# Patient Record
Sex: Female | Born: 1950 | Race: White | Marital: Married | Smoking: Never smoker
Health system: Southern US, Community
[De-identification: ages and names within clinical notes are randomized; demographics above are authoritative.]

## PROBLEM LIST (undated history)

## (undated) DIAGNOSIS — S022XXA Fracture of nasal bones, initial encounter for closed fracture: Secondary | ICD-10-CM

## (undated) DIAGNOSIS — H9190 Unspecified hearing loss, unspecified ear: Secondary | ICD-10-CM

## (undated) DIAGNOSIS — M810 Age-related osteoporosis without current pathological fracture: Secondary | ICD-10-CM

## (undated) DIAGNOSIS — H9319 Tinnitus, unspecified ear: Secondary | ICD-10-CM

## (undated) DIAGNOSIS — S62609A Fracture of unspecified phalanx of unspecified finger, initial encounter for closed fracture: Secondary | ICD-10-CM

## (undated) DIAGNOSIS — S0590XA Unspecified injury of unspecified eye and orbit, initial encounter: Secondary | ICD-10-CM

## (undated) HISTORY — PX: TONSILLECTOMY: SHX5217

## (undated) HISTORY — DX: Unspecified injury of unspecified eye and orbit, initial encounter: S05.90XA

## (undated) HISTORY — DX: Age-related osteoporosis without current pathological fracture: M81.0

## (undated) HISTORY — DX: Fracture of nasal bones, initial encounter for closed fracture: S02.2XXA

## (undated) HISTORY — DX: Unspecified hearing loss, unspecified ear: H91.90

## (undated) HISTORY — DX: Fracture of unspecified phalanx of unspecified finger, initial encounter for closed fracture: S62.609A

## (undated) HISTORY — PX: FACIAL COSMETIC SURGERY: SHX629

## (undated) HISTORY — DX: Tinnitus, unspecified ear: H93.19

---

## 1999-05-25 ENCOUNTER — Other Ambulatory Visit: Admission: RE | Admit: 1999-05-25 | Discharge: 1999-05-25 | Payer: Self-pay | Admitting: *Deleted

## 2000-06-05 ENCOUNTER — Other Ambulatory Visit: Admission: RE | Admit: 2000-06-05 | Discharge: 2000-06-05 | Payer: Self-pay | Admitting: *Deleted

## 2006-10-17 ENCOUNTER — Other Ambulatory Visit: Admission: RE | Admit: 2006-10-17 | Discharge: 2006-10-17 | Payer: Self-pay | Admitting: Obstetrics & Gynecology

## 2007-10-26 ENCOUNTER — Other Ambulatory Visit: Admission: RE | Admit: 2007-10-26 | Discharge: 2007-10-26 | Payer: Self-pay | Admitting: Obstetrics & Gynecology

## 2008-10-27 ENCOUNTER — Other Ambulatory Visit: Admission: RE | Admit: 2008-10-27 | Discharge: 2008-10-27 | Payer: Self-pay | Admitting: Obstetrics & Gynecology

## 2011-11-04 LAB — HM MAMMOGRAPHY

## 2013-02-17 ENCOUNTER — Encounter: Payer: Self-pay | Admitting: Obstetrics & Gynecology

## 2013-02-18 ENCOUNTER — Encounter: Payer: Self-pay | Admitting: Obstetrics & Gynecology

## 2013-02-18 ENCOUNTER — Ambulatory Visit (INDEPENDENT_AMBULATORY_CARE_PROVIDER_SITE_OTHER): Payer: BC Managed Care – PPO | Admitting: Obstetrics & Gynecology

## 2013-02-18 VITALS — BP 110/76 | HR 78 | Resp 16 | Ht 64.5 in | Wt 139.6 lb

## 2013-02-18 DIAGNOSIS — Z01419 Encounter for gynecological examination (general) (routine) without abnormal findings: Secondary | ICD-10-CM

## 2013-02-18 NOTE — Progress Notes (Addendum)
62 y.o. [id number] MarriedCaucasianF here for annual exam.  Doing well.  Stopped Prolia due to joint/bone pains.    Patient's last menstrual period was 11/23/2004.          Sexually active: no  The current method of family planning is none.    Exercising: no  The patient does not participate in regular exercise at present. Smoker:  no  Health Maintenance: Pap:  01/30/12 History of abnormal Pap:  yes MMG:  12/30/12, [facility] Colonoscopy:  05/29/12, Dr. Loreta Ave, F/U 10 years.  Pt says she will not do another one! BMD:   04/30/11 TDaP:  11/09/09 Screening Labs: 5/14 with Dr. Record, Hb today: none, Urine today: none.  Declines labs today had  Them at PCP one month ago.   reports that she has never smoked. She has never used smokeless tobacco. She reports that she does not drink alcohol or use illicit drugs.  Past Medical History  Diagnosis Date  . Hearing loss   . Osteoporosis     Past Surgical History  Procedure Laterality Date  . Tonsillectomy    . Facial cosmetic surgery      Current Outpatient Prescriptions  Medication Sig Dispense Refill  . aspirin-acetaminophen-caffeine (EXCEDRIN MIGRAINE) 250-250-65 MG per tablet Take 1 tablet by mouth every 6 (six) hours as needed for pain.      Marland Kitchen buPROPion (WELLBUTRIN XL) 300 MG 24 hr tablet Take 300 mg by mouth daily.      . Coenzyme Q10 (CO Q 10 PO) Take by mouth.      . etodolac (LODINE) 400 MG tablet Take 400 mg by mouth 2 (two) times daily.      . Multiple Vitamin (MULTIVITAMIN) tablet Take 1 tablet by mouth daily.      . Sod Monofluorphosphate-Ca Carb (MONOCAL) 22.75-625 MG TABS Take by mouth.       No current facility-administered medications for this visit.    Family History  Problem Relation Age of Onset  . Breast cancer Maternal Grandmother   . Heart disease    . Parkinson's disease Mother   . Heart disease Father     ROS:  Pertinent items are noted in HPI.  Otherwise, a comprehensive ROS was  negative.  Exam:   BP 110/76  Pulse 78  Resp 16  Ht 5' 4.5" (1.638 m)  Wt 139 lb 9.6 oz (63.322 kg)  BMI 23.6 kg/m2  LMP 11/23/2004  Weight change: +3lb   Height: 5' 4.5" (163.8 cm)  Ht Readings from Last 3 Encounters:  02/18/13 5' 4.5" (1.638 m)    General appearance: alert, cooperative and appears stated age Head: Normocephalic, without obvious abnormality, atraumatic Neck: no adenopathy, supple, symmetrical, trachea midline and thyroid normal to inspection and palpation Lungs: clear to auscultation bilaterally Breasts: normal appearance, no masses or tenderness Heart: regular rate and rhythm Abdomen: soft, non-tender; bowel sounds normal; no masses,  no organomegaly Extremities: extremities normal, atraumatic, no cyanosis or edema Skin: Skin color, texture, turgor normal. No rashes or lesions Lymph nodes: Cervical, supraclavicular, and axillary nodes normal. No abnormal inguinal nodes palpated Neurologic: Grossly normal   Pelvic: External genitalia:  no lesions              Urethra:  normal appearing urethra with no masses, tenderness or lesions              Bartholins and Skenes: normal  Vagina: normal appearing vagina with normal color and discharge, no lesions              Cervix: no lesions              Pap taken: no Bimanual Exam:  Uterus:  normal size, contour, position, consistency, mobility, non-tender              Adnexa: normal adnexa and no mass, fullness, tenderness               Rectovaginal: Confirms               Anus:  normal sphincter tone, no lesions  A:  Well Woman with normal exam PMP, no HRT Osteoporosis.  Declines any other treatment at this time. P:   Mammogram yearly.   pap smear with neg HR HPV one year ago.  No Pap today. Yearly derm appt.  09/18/12. Will attempt to get BMD to see when next one it due.   return annually or prn  An After Visit Summary was printed and given to the patient.  02/23/13--Got BMD from [facility].  Was done 04/30/11.  Will notify patient and see where she wants to go for BMD this fall.  MSM.

## 2013-02-18 NOTE — Patient Instructions (Addendum)

## 2013-05-10 ENCOUNTER — Telehealth: Payer: Self-pay | Admitting: Obstetrics & Gynecology

## 2013-05-10 NOTE — Telephone Encounter (Signed)
Patient calling for bone density results from 05/07/13.

## 2013-05-11 NOTE — Telephone Encounter (Signed)
Spoke with patient. Results given from paper form per Dr. Hyacinth Meeker, stable from 2012. Can make office appointment to discuss IV reclast if patient would like, patient declines at this time.   Patient would like to come pick up results. Paper to front desk with Greta.

## 2014-04-19 ENCOUNTER — Ambulatory Visit: Payer: BC Managed Care – PPO | Admitting: Obstetrics & Gynecology

## 2014-06-27 ENCOUNTER — Encounter: Payer: Self-pay | Admitting: Obstetrics & Gynecology

## 2014-11-04 ENCOUNTER — Encounter: Payer: Self-pay | Admitting: Obstetrics & Gynecology

## 2014-11-04 ENCOUNTER — Ambulatory Visit (INDEPENDENT_AMBULATORY_CARE_PROVIDER_SITE_OTHER): Payer: BC Managed Care – PPO | Admitting: Obstetrics & Gynecology

## 2014-11-04 VITALS — BP 118/60 | HR 68 | Resp 16 | Ht 64.0 in | Wt 144.8 lb

## 2014-11-04 DIAGNOSIS — M810 Age-related osteoporosis without current pathological fracture: Secondary | ICD-10-CM | POA: Insufficient documentation

## 2014-11-04 DIAGNOSIS — F419 Anxiety disorder, unspecified: Secondary | ICD-10-CM | POA: Insufficient documentation

## 2014-11-04 DIAGNOSIS — H9190 Unspecified hearing loss, unspecified ear: Secondary | ICD-10-CM | POA: Insufficient documentation

## 2014-11-04 DIAGNOSIS — Z01419 Encounter for gynecological examination (general) (routine) without abnormal findings: Secondary | ICD-10-CM

## 2014-11-04 DIAGNOSIS — Z124 Encounter for screening for malignant neoplasm of cervix: Secondary | ICD-10-CM | POA: Diagnosis not present

## 2014-11-04 DIAGNOSIS — M545 Low back pain, unspecified: Secondary | ICD-10-CM | POA: Insufficient documentation

## 2014-11-04 DIAGNOSIS — M546 Pain in thoracic spine: Secondary | ICD-10-CM | POA: Insufficient documentation

## 2014-11-04 DIAGNOSIS — G43909 Migraine, unspecified, not intractable, without status migrainosus: Secondary | ICD-10-CM | POA: Insufficient documentation

## 2014-11-04 NOTE — Progress Notes (Signed)
64 y.o. [id number] MarriedCaucasianF here for annual exam.  Doing well.  Reports she fell last May and broke her left hand.  Was in a cast for several weeks.  Didn't go physical therapy after surgery.    No vaginal bleeding.   Started Prolia last year.  Did have two injections but had side effects.    Dr. Record is PCP and does all her lab work.   Patient's last menstrual period was 11/23/2004.          Sexually active: No.  The current method of family planning is post menopausal status.    Exercising: Yes.    gym Smoker:  no  Health Maintenance: Pap:  01/29/12 WNL/negative HR HPV History of abnormal Pap:  no MMG:  01/10/14-normal Colonoscopy:  05/29/12-repeat in 10 years Dr Loreta AveMann BMD:   04/2013.  Did have two injections.   TDaP:  11/09/09 Screening Labs: PCP, Hb today: PCP, Urine today: not able to give sample   reports that she has never smoked. She has never used smokeless tobacco. She reports that she does not drink alcohol or use illicit drugs.  Past Medical History  Diagnosis Date  . Hearing loss   . Osteoporosis   . Broken finger 01/01/14    fell-left side    Past Surgical History  Procedure Laterality Date  . Tonsillectomy    . Facial cosmetic surgery      Current Outpatient Prescriptions  Medication Sig Dispense Refill  . ALPRAZolam (XANAX) 0.5 MG tablet Take 0.5 mg by mouth. Just as needed    . aspirin-acetaminophen-caffeine (EXCEDRIN MIGRAINE) 250-250-65 MG per tablet Take 1 tablet by mouth every 6 (six) hours as needed for pain.    Marland Kitchen. buPROPion (WELLBUTRIN XL) 300 MG 24 hr tablet Take 300 mg by mouth daily.    Marland Kitchen. etodolac (LODINE) 400 MG tablet Take 400 mg by mouth 2 (two) times daily.    . Multiple Vitamin (MULTIVITAMIN) tablet Take 1 tablet by mouth daily.    . NON FORMULARY Shaklee Osteomatric    . Sod Monofluorphosphate-Ca Carb (MONOCAL) 22.75-625 MG TABS Take by mouth.    . pantoprazole (PROTONIX) 40 MG tablet TAKE 1 TABLET (40 MG TOTAL) BY MOUTH DAILY.     No  current facility-administered medications for this visit.    Family History  Problem Relation Age of Onset  . Breast cancer Maternal Grandmother   . Heart disease    . Parkinson's disease Mother   . Heart disease Father     ROS:  Pertinent items are noted in HPI.  Otherwise, a comprehensive ROS was negative.  Exam:   BP 118/60 mmHg  Pulse 68  Resp 16  Ht 5\' 4"  (1.626 m)  Wt 144 lb 12.8 oz (65.681 kg)  BMI 24.84 kg/m2  LMP 11/23/2004   Height: 5\' 4"  (162.6 cm)  Ht Readings from Last 3 Encounters:  11/04/14 5\' 4"  (1.626 m)  02/18/13 5' 4.5" (1.638 m)    General appearance: alert, cooperative and appears stated age Head: Normocephalic, without obvious abnormality, atraumatic Neck: no adenopathy, supple, symmetrical, trachea midline and thyroid normal to inspection and palpation Lungs: clear to auscultation bilaterally Breasts: normal appearance, no masses or tenderness Heart: regular rate and rhythm Abdomen: soft, non-tender; bowel sounds normal; no masses,  no organomegaly Extremities: extremities normal, atraumatic, no cyanosis or edema Skin: Skin color, texture, turgor normal. No rashes or lesions Lymph nodes: Cervical, supraclavicular, and axillary nodes normal. No abnormal inguinal nodes palpated Neurologic: Grossly normal  Pelvic: External genitalia:  no lesions              Urethra:  normal appearing urethra with no masses, tenderness or lesions              Bartholins and Skenes: normal                 Vagina: normal appearing vagina with normal color and discharge, no lesions              Cervix: no lesions              Pap taken: Yes.   Bimanual Exam:  Uterus:  normal size, contour, position, consistency, mobility, non-tender              Adnexa: normal adnexa and no mass, fullness, tenderness               Rectovaginal: Confirms               Anus:  normal sphincter tone, no lesions  Chaperone was present for exam.  A:  Well Woman with normal exam PMP,  no HRT Osteoporosis. Did two Prolia injections but did have side effects so stopped  P: Mammogram yearly.  pap smear with neg HR HPV  2014.  Pap today. Yearly derm appt.  PCP:  Dr Record does all her blood work. return annually or prn

## 2014-11-09 LAB — IPS PAP TEST WITH REFLEX TO HPV

## 2014-11-28 DIAGNOSIS — K219 Gastro-esophageal reflux disease without esophagitis: Secondary | ICD-10-CM | POA: Insufficient documentation

## 2015-02-07 DIAGNOSIS — M722 Plantar fascial fibromatosis: Secondary | ICD-10-CM | POA: Insufficient documentation

## 2015-05-16 ENCOUNTER — Telehealth: Payer: Self-pay

## 2015-05-16 DIAGNOSIS — M25559 Pain in unspecified hip: Secondary | ICD-10-CM | POA: Insufficient documentation

## 2015-05-16 NOTE — Telephone Encounter (Signed)
-----   Message from Jerene Bears, MD sent at 05/07/2015  6:36 AM EDT ----- Tresa Endo, Pt needs BMD scheduled at [facility] center.  Can you take care of this? Thanks.  Rosalita Chessman

## 2015-05-16 NOTE — Telephone Encounter (Signed)
Left detailed message, ok per DPR, that BMD has been scheduled at [facility] on 05/23/15 at 2:00. And to arrive 15 mins prior to this. They will also need order faxed, # [phone].

## 2015-05-23 NOTE — Telephone Encounter (Signed)
Patient called she received your message wanted to confirm address, patient aware of appointment date and time.

## 2015-06-15 ENCOUNTER — Telehealth: Payer: Self-pay | Admitting: Obstetrics & Gynecology

## 2015-06-15 NOTE — Telephone Encounter (Signed)
Patient notified of BMD results from [facility]. Copy will be scanned into epic.//kn

## 2015-06-15 NOTE — Telephone Encounter (Signed)
Patient is calling to see if she can discuss her Bone Density with the nurse.

## 2015-06-15 NOTE — Telephone Encounter (Signed)
Appointment made for BMD consult.//kn

## 2015-06-15 NOTE — Telephone Encounter (Signed)
Spoke with patient. Patient states that she spoke with Tresa EndoKelly a few days ago regarding her Bone Density. Was advised Dr.Miller would like her to come in for a consult appointment. Patient is waiting to hear back from [place] about if she still needs this appointment and the cost. "I never heard back so I was not sure if they just thought it was okay not to have it." Advised I will need to speak with Tresa EndoKelly regarding recommendations and information and have her return call. No open telephone note regarding bone density results and recommendations.

## 2015-06-19 ENCOUNTER — Encounter: Payer: Self-pay | Admitting: Obstetrics & Gynecology

## 2015-06-19 ENCOUNTER — Ambulatory Visit (INDEPENDENT_AMBULATORY_CARE_PROVIDER_SITE_OTHER): Payer: BC Managed Care – PPO | Admitting: Obstetrics & Gynecology

## 2015-06-19 VITALS — BP 118/72 | HR 64 | Resp 16 | Wt 141.0 lb

## 2015-06-19 DIAGNOSIS — F339 Major depressive disorder, recurrent, unspecified: Secondary | ICD-10-CM | POA: Insufficient documentation

## 2015-06-19 DIAGNOSIS — M810 Age-related osteoporosis without current pathological fracture: Secondary | ICD-10-CM | POA: Diagnosis not present

## 2015-06-19 LAB — PHOSPHORUS: Phosphorus: 3.8 mg/dL (ref 2.5–4.5)

## 2015-06-19 LAB — MAGNESIUM: Magnesium: 2.3 mg/dL (ref 1.5–2.5)

## 2015-06-19 NOTE — Progress Notes (Signed)
Subjective:    [age] yrs Married Caucasian G2P2002  female here to discuss recent BMD obtained 05/23/15 showing osteoporosis with T score -3.0 in left hip and spine.  Pt is a calcium supplement with Vit D and a MVI daily.  She admits her diet isn't "great".    She has tried Fosamax, Actonel, Evista.  Took two injections of prolia last year.  She ends up stopping medications because of the side effects of body aches and specifically legs.  She's had at least two cardiac evaluations due to the body pains.  It always resolves when she stops the medication.  She is very apprehensive about using Reclast.  Osteoporosis Risk Factors  Nonmodifiable  Personal Hx of fracture as an adult: no Hx of fracture in first-degree relative: no Caucasian race: yes Advanced age: no Female sex: yes Dementia: no Poor health/frailty: no  Potentially modifiable: Tobacco use: no Low body weight (<127 lbs): no Estrogen deficiency  early menopause (age <45) or bilateral ovariectomy: no  prolonged premenopausal amenorrhea (>1 yr): no Low calcium intake (lifelong): yes Alcohol use more than 2 drinks per day: no Recurrent falls: no Inadequate physical activity: Pt walking on treadmill and weight circuit three times weekly  Current calcium and Vit D intake: around 900mg  calcium with Vit D (pt unsure of dosage)  Review of Systems Pertinent items noted in HPI and remainder of comprehensive ROS otherwise negative.     Objective:   PHYSICAL EXAM BP 118/72 mmHg  Pulse 64  Resp 16  Wt 141 lb (63.957 kg)  LMP 11/23/2004 General appearance: alert, cooperative and appears stated age  Imaging Bone Density: Spine T Score: -3.0 Hip T score: -3.0   Done on 05/23/15 FRAX score:  Not calculated due to osteoporosis                                    Assessment:   Osteoporosis with T score -3.0 left hip and spine   Plan:   1.  Patient counseled in adequate calcium and vitamin D exposure. 2.  Exercise recommended  at least 30 minutes 3 times per week.  3.  Encouraged minimal ETOH, continued non-smoking status.  Recommendation to avoid heavy ETOH use.  4.  Counseled to avoid tobacco and second had smoke. 5.  Fall prevention discussed. 6.  Pt declines any additional medication treatment at this time 7.  Magnesium, phosphorus, PTH with intact calcium, Vit D today.  (reviewed Care Everywhere and she's had a TSH and CMP in the last few months) 7.  Repeat bone density in 2 years.     ~20 minutes spent with patient >50% of time was in face to face discussion of above.

## 2015-06-20 LAB — VITAMIN D 25 HYDROXY (VIT D DEFICIENCY, FRACTURES): Vit D, 25-Hydroxy: 31 ng/mL (ref 30–100)

## 2015-06-20 LAB — PTH, INTACT AND CALCIUM
Calcium: 9.9 mg/dL (ref 8.4–10.5)
PTH: 42 pg/mL (ref 14–64)

## 2015-06-22 ENCOUNTER — Telehealth: Payer: Self-pay

## 2015-06-22 NOTE — Telephone Encounter (Signed)
Lmtcb//kn 

## 2015-06-22 NOTE — Telephone Encounter (Signed)
-----   Message from Jerene BearsMary S Miller, MD sent at 06/21/2015  2:42 PM EDT ----- Inform pt PTH with calcium, Vit D, magnesium and phosphorus were all normal.  Pt does not want to be on any additional medication.  Repeat BMD two years.

## 2015-06-22 NOTE — Telephone Encounter (Signed)
Patient returning your call.

## 2015-06-26 NOTE — Telephone Encounter (Signed)
Patient is returning a call to Kelly °

## 2015-06-29 NOTE — Telephone Encounter (Signed)
Patient notified of results.//kn 

## 2016-01-09 ENCOUNTER — Ambulatory Visit (INDEPENDENT_AMBULATORY_CARE_PROVIDER_SITE_OTHER): Payer: Medicare Other | Admitting: Obstetrics & Gynecology

## 2016-01-09 ENCOUNTER — Encounter: Payer: Self-pay | Admitting: Obstetrics & Gynecology

## 2016-01-09 VITALS — BP 120/76 | HR 76 | Resp 14 | Ht 64.0 in | Wt 144.2 lb

## 2016-01-09 DIAGNOSIS — Z01419 Encounter for gynecological examination (general) (routine) without abnormal findings: Secondary | ICD-10-CM
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
## 2016-04-30 ENCOUNTER — Telehealth: Payer: Self-pay | Admitting: *Deleted

## 2016-04-30 NOTE — Telephone Encounter (Signed)
Patient is in 04 recall for Left DX Mammogram. Please contact patient and schedule or get results if she already had this follow up done Thanks Consuella LoseElaine

## 2016-05-01 NOTE — Telephone Encounter (Signed)
1225- Call to patient. Patient states she had her MMG done on 03/15/16 and our office was on the release form to get a copy. RN advised patient she would follow up.   1330- Request form faxed to [facility], fax # [phone] for release of MMG report.

## 2016-05-07 NOTE — Telephone Encounter (Signed)
Recall placed for 03/2016 for 1 year follow up DX left breast mammogram. -eh

## 2016-05-21 ENCOUNTER — Encounter: Payer: Self-pay | Admitting: Obstetrics & Gynecology

## 2016-09-10 DIAGNOSIS — J343 Hypertrophy of nasal turbinates: Secondary | ICD-10-CM | POA: Insufficient documentation

## 2016-10-02 HISTORY — PX: NOSE SURGERY: SHX723

## 2017-01-09 ENCOUNTER — Encounter: Payer: Self-pay | Admitting: Obstetrics & Gynecology

## 2017-01-09 ENCOUNTER — Ambulatory Visit (INDEPENDENT_AMBULATORY_CARE_PROVIDER_SITE_OTHER): Payer: Medicare Other | Admitting: Obstetrics & Gynecology

## 2017-01-09 ENCOUNTER — Other Ambulatory Visit (HOSPITAL_COMMUNITY)
Admission: RE | Admit: 2017-01-09 | Discharge: 2017-01-09 | Disposition: A | Discharge status: Home / Self Care | Payer: Medicare Other | Source: Ambulatory Visit | Attending: Obstetrics & Gynecology | Admitting: Obstetrics & Gynecology

## 2017-01-09 VITALS — BP 110/70 | HR 84 | Resp 16 | Ht 64.0 in | Wt 148.0 lb

## 2017-01-09 DIAGNOSIS — Z01419 Encounter for gynecological examination (general) (routine) without abnormal findings: Secondary | ICD-10-CM | POA: Diagnosis not present

## 2017-01-09 DIAGNOSIS — Z124 Encounter for screening for malignant neoplasm of cervix: Secondary | ICD-10-CM | POA: Diagnosis present

## 2017-01-09 DIAGNOSIS — M810 Age-related osteoporosis without current pathological fracture: Secondary | ICD-10-CM | POA: Diagnosis not present
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
## 2017-01-10 ENCOUNTER — Ambulatory Visit: Payer: Medicare Other | Admitting: Obstetrics & Gynecology

## 2017-01-10 LAB — CYTOLOGY - PAP: DIAGNOSIS: NEGATIVE

## 2017-04-18 ENCOUNTER — Ambulatory Visit: Payer: Medicare Other | Admitting: Obstetrics & Gynecology

## 2017-05-20 ENCOUNTER — Encounter: Payer: Self-pay | Admitting: Obstetrics & Gynecology

## 2017-05-23 ENCOUNTER — Encounter: Payer: Self-pay | Admitting: *Deleted

## 2017-05-23 ENCOUNTER — Telehealth: Payer: Self-pay | Admitting: *Deleted

## 2017-05-23 NOTE — Telephone Encounter (Signed)
-----   Message from Jerene Bears, MD sent at 05/23/2017  8:18 AM EDT ----- Regarding: BMD Irving Burton, Please call pt.  She needs a BMD scheduled.  Can you see if she needs help with scheduling this with [facility]?  Thanks.  Rosalita Chessman

## 2017-05-23 NOTE — Telephone Encounter (Signed)
Returned call to patient. Advised patient RN scheduled her BMD scan for Thursday 06/05/17 at 1400. Patient agreeable to date and time of appointment and aware to call [facility] if she needs to reschedule. Order for review and signature to Dr. Rondel Baton desk.   Routing to provider for final review. Patient agreeable to disposition. Will close encounter.

## 2017-05-23 NOTE — Telephone Encounter (Signed)
Call to patient. Patient states she has not made an appointment for BMD scan yet. RN offered to help get patient scheduled. Patient agreeable. Patient does BMD scans at [facility]. Patient aware RN will return call with appointment date and time.

## 2017-05-23 NOTE — Telephone Encounter (Signed)
Call to [facility]. Spoke with [place]. Patient scheduled for BMD scan on Thursday 06/05/17 at 1400.

## 2017-05-27 NOTE — Telephone Encounter (Signed)
BMD order faxed to [facility]. Fax #: [phone].

## 2018-03-27 ENCOUNTER — Encounter: Payer: Self-pay | Admitting: Obstetrics & Gynecology

## 2018-03-27 ENCOUNTER — Ambulatory Visit (INDEPENDENT_AMBULATORY_CARE_PROVIDER_SITE_OTHER): Payer: Medicare Other | Admitting: Obstetrics & Gynecology

## 2018-03-27 ENCOUNTER — Other Ambulatory Visit: Payer: Self-pay

## 2018-03-27 VITALS — BP 110/70 | HR 80 | Resp 16 | Ht 63.5 in | Wt 137.8 lb

## 2018-03-27 DIAGNOSIS — Z01419 Encounter for gynecological examination (general) (routine) without abnormal findings: Secondary | ICD-10-CM

## 2018-03-27 MED ORDER — BUPROPION HCL ER (XL) 150 MG PO TB24: 150.0000 mg | ORAL_TABLET | Freq: Every day | ORAL | 4 refills | Status: DC

## 2018-03-27 NOTE — Patient Instructions (Signed)
[facility] [address], [place], [place] [address]  Phone: [phone]

## 2018-03-27 NOTE — Progress Notes (Signed)
67 y.o. [id number] MarriedCaucasianF here for annual exam.  Doing well.  Denies vaginal bleeding.  She isn't having nose bleeds like she was last year.    Denies vaginal bleeding.  BMD 11/18 showed t score worsening -3.4 and -3.4.  Does not want to take any medications.    Had blood work done last month at [facility].  PCP is leaving practice.    Patient's last menstrual period was 11/23/2004.          Sexually active: No.  The current method of family planning is post menopausal status.    Exercising: Yes.    walking Smoker:  no  Health Maintenance: Pap:  01/09/17 Neg   11/04/14 neg  History of abnormal Pap:  Yes, remote hx MMG:  03/19/18 BIRADS1:Neg   Colonoscopy:  05/29/12 f/u 10 years  BMD:   06/05/17 Osteoporosis.  t score -3.4.  This was worse than imaging in 2016.   TDaP:  2011 Pneumonia vaccine(s):  2018 Shingrix: Zostavax 11/13.  Declines Shingrix. Hep C testing: 11/17 Screening Labs: PCP   reports that she has never smoked. She has never used smokeless tobacco. She reports that she does not drink alcohol or use drugs.  Past Medical History:  Diagnosis Date  . Broken finger 01/01/14   fell-left side  . Eye injury    cut with nail.   Marland Kitchen Hearing loss   . Nose fracture 07/2016   due to fall   . Osteoporosis     Past Surgical History:  Procedure Laterality Date  . FACIAL COSMETIC SURGERY    . NOSE SURGERY  10/02/2016   due to injury  . TONSILLECTOMY      Current Outpatient Medications  Medication Sig Dispense Refill  . aspirin-acetaminophen-caffeine (EXCEDRIN MIGRAINE) 250-250-65 MG per tablet Take 1 tablet by mouth every 6 (six) hours as needed for pain.    Marland Kitchen buPROPion (WELLBUTRIN XL) 300 MG 24 hr tablet Take 300 mg by mouth daily.    . calcium carbonate (CALCIUM 600) 600 MG TABS tablet Take by mouth daily.    . Multiple Vitamin (MULTIVITAMIN) tablet Take 1 tablet by mouth daily.    . NON FORMULARY Shaklee Osteomatric     No current facility-administered  medications for this visit.     Family History  Problem Relation Age of Onset  . Breast cancer Maternal Grandmother   . Heart disease Unknown   . Parkinson's disease Mother   . Heart disease Father     Review of Systems  Psychiatric/Behavioral: Positive for depression. The patient is nervous/anxious.   All other systems reviewed and are negative.   Exam:   BP 110/70 (BP Location: Right Arm, Patient Position: Sitting, Cuff Size: Normal)   Pulse 80   Resp 16   Ht 5' 3.5" (1.613 m)   Wt 137 lb 12.8 oz (62.5 kg)   LMP 11/23/2004   BMI 24.03 kg/m   Height:   Height: 5' 3.5" (161.3 cm)  Ht Readings from Last 3 Encounters:  03/27/18 5' 3.5" (1.613 m)  01/09/17 5\' 4"  (1.626 m)  01/09/16 5\' 4"  (1.626 m)    General appearance: alert, cooperative and appears stated age Head: Normocephalic, without obvious abnormality, atraumatic Neck: no adenopathy, supple, symmetrical, trachea midline and thyroid normal to inspection and palpation Lungs: clear to auscultation bilaterally Breasts: normal appearance, no masses or tenderness Heart: regular rate and rhythm Abdomen: soft, non-tender; bowel sounds normal; no masses,  no organomegaly, small umbilical hernia noted Extremities: extremities normal,  atraumatic, no cyanosis or edema Skin: Skin color, texture, turgor normal. No rashes or lesions Lymph nodes: Cervical, supraclavicular, and axillary nodes normal. No abnormal inguinal nodes palpated Neurologic: Grossly normal   Pelvic: External genitalia:  no lesions              Urethra:  normal appearing urethra with no masses, tenderness or lesions              Bartholins and Skenes: normal                 Vagina: normal appearing vagina with normal color and discharge, no lesions              Cervix: no lesions              Pap taken: No. Bimanual Exam:  Uterus:  normal size, contour, position, consistency, mobility, non-tender              Adnexa: normal adnexa and no mass, fullness,  tenderness               Rectovaginal: Confirms               Anus:  normal sphincter tone, no lesions  Chaperone was present for exam.  A:  Well Woman with normal exam PMP, no HRT Osteoporosis.  Had two prolia injections.  Has stopped due to side effects. H/O depression.  Desires to decrease dosing. Small umbilical hernia  P:   Mammogram guidelines reviewed. pap smear not indicated Had blood work done last week Declines any treatment for BMD.  Secondary causes blood work done 2016. Pt will decrease wellbutrin to 150mg  XL daily for at least two month before tapering further.  Will call with any changes in mood.   Return annually or prn

## 2018-04-09 ENCOUNTER — Encounter: Payer: Self-pay | Admitting: Obstetrics & Gynecology

## 2018-04-18 ENCOUNTER — Other Ambulatory Visit: Payer: Self-pay | Admitting: Obstetrics & Gynecology

## 2019-01-07 ENCOUNTER — Ambulatory Visit: Payer: Medicare Other | Admitting: Obstetrics & Gynecology

## 2019-01-07 ENCOUNTER — Telehealth: Payer: Self-pay | Admitting: Obstetrics & Gynecology

## 2019-01-07 ENCOUNTER — Encounter: Payer: Self-pay | Admitting: Obstetrics & Gynecology

## 2019-01-07 ENCOUNTER — Other Ambulatory Visit: Payer: Self-pay

## 2019-01-07 VITALS — BP 120/78 | HR 70 | Temp 98.0°F | Resp 16 | Wt 151.0 lb

## 2019-01-07 DIAGNOSIS — R109 Unspecified abdominal pain: Secondary | ICD-10-CM

## 2019-01-07 LAB — POCT URINALYSIS DIPSTICK
Bilirubin, UA: NEGATIVE
Blood, UA: NEGATIVE
Glucose, UA: NEGATIVE
Ketones, UA: NEGATIVE
Leukocytes, UA: NEGATIVE
Nitrite, UA: NEGATIVE
Protein, UA: NEGATIVE
Urobilinogen, UA: NEGATIVE E.U./dL — AB
pH, UA: 5 (ref 5.0–8.0)

## 2019-01-07 NOTE — Telephone Encounter (Signed)
Patient has a hernia and believes it has grown. She is wearing a corset for pain. Would like to know if Dr. Hyacinth Meeker can assist with this or could recommend a provider?

## 2019-01-07 NOTE — Telephone Encounter (Signed)
Spoke with patient. Patient states that at her aex on 03/2018 Dr.Miller noticed that she had a small umbilical hernia. States that over the last few weeks she has been having constant pain. Pain worsens with bending and lifting to 8/10. Wearing a corset to put pressure on the area for relief. If she is not wearing the corset she has to press in on her abdomen to relieve the pain. Unable to eat much due to feeling bloated. Able to only perform limited activity without being in tears and having to sit down. Does not have a PCP. Advised will review with Dr.Miller and return call. Patient is agreeable.  Reviewed with Dr.Miller. Patient needs an OV today. Appointment scheduled for today at 3:45 pm with Dr.Miller. Patient is agreeable to date and time.  Routing to provider and will close encounter.

## 2019-01-07 NOTE — Progress Notes (Signed)
GYNECOLOGY  VISIT  CC:   Abdominal pain  HPI: 68 y.o. G43P2002 Married Other or two or more races female here for increased abdominal pain that has been present for the past "several months".  Pt cannot state exactly when this began.  She tends to have epigastric pain and then lower pelvic pain.  She was worried this was coming from her umbilical hernia that we discussed at her last visit.  She does wear a tighter piece of clothing to help with the discomfort.  It is worse with bending.  Denies diarrhea or constipation.  Does have occasional nausea but no emesis.  She does have bloating as well.  Denies fever.  Denies vaginal bleeding.  Denies vaginal discharge.  Denies urinary symptoms.    Colonoscopy was 2013 and normal.  GYNECOLOGIC HISTORY: Patient's last menstrual period was 11/23/2004. Contraception: post menopausal Menopausal hormone therapy: none poct urine-neg  Patient Active Problem List   Diagnosis Date Noted  . Hypertrophy of inferior nasal turbinate 09/10/2016  . Recurrent depressive disorder (HCC) 06/19/2015  . Arthralgia of hip 05/16/2015  . Plantar fasciitis 02/07/2015  . Acid reflux 11/28/2014  . Anxiety 11/04/2014  . Decrease in the ability to hear 11/04/2014  . LBP (low back pain) 11/04/2014  . Osteoporosis 11/04/2014  . Back pain, thoracic 11/04/2014  . Headache, migraine 11/04/2014    Past Medical History:  Diagnosis Date  . Broken finger 01/01/14   fell-left side  . Eye injury    cut with nail.   Marland Kitchen Hearing loss   . Nose fracture 07/2016   due to fall   . Osteoporosis     Past Surgical History:  Procedure Laterality Date  . FACIAL COSMETIC SURGERY    . NOSE SURGERY  10/02/2016   due to injury  . TONSILLECTOMY      MEDS:   Current Outpatient Medications on File Prior to Visit  Medication Sig Dispense Refill  . calcium carbonate (CALCIUM 600) 600 MG TABS tablet Take by mouth daily.    . Multiple Vitamin (MULTIVITAMIN) tablet Take 1 tablet by mouth  daily.    . NON FORMULARY Shaklee Osteomatric    . aspirin-acetaminophen-caffeine (EXCEDRIN MIGRAINE) 250-250-65 MG per tablet Take 1 tablet by mouth every 6 (six) hours as needed for pain.     No current facility-administered medications on file prior to visit.     ALLERGIES: Boniva [ibandronic acid]; Evista [raloxifene]; Lexapro [escitalopram oxalate]; Other; and Prolia [denosumab]  Family History  Problem Relation Age of Onset  . Breast cancer Maternal Grandmother   . Heart disease Unknown   . Parkinson's disease Mother   . Heart disease Father     SH:  Married, non smoker  Review of Systems  Constitutional: Negative.   HENT: Negative.   Eyes: Negative.   Respiratory: Negative.   Cardiovascular: Negative.   Gastrointestinal: Positive for abdominal pain.  Endocrine: Negative.   Genitourinary: Negative.   Musculoskeletal: Negative.   Skin: Negative.   Allergic/Immunologic: Negative.   Neurological: Negative.   Hematological: Negative.   Psychiatric/Behavioral: Negative.     PHYSICAL EXAMINATION:    BP 120/78   Pulse 70   Temp 98 F (36.7 C) (Skin)   Resp 16   Wt 151 lb (68.5 kg)   LMP 11/23/2004   BMI 26.33 kg/m     General appearance: alert, cooperative and appears stated age CV:  Regular rate and rhythm Lungs:  clear to auscultation, no wheezes, rales or rhonchi, symmetric air entry  Abdomen: soft, mild to moderate tenderness to palpation in epigastric region and in lower mid pelvic region, small umbilical hernia that has not changed in size and is non tender, normal bowel sounds Lymph:  no inguinal LAD noted  Chaperone was present for exam.  Assessment: Abdominal pain Umbilical hernia  Plan: CBC, CMP, liver panel obtained.  If normal, will proceed with CT Abd and pelvis If CT is negative, will refer to GI for possible IBS.

## 2019-01-08 LAB — CBC
Hematocrit: 41.2 % (ref 34.0–46.6)
Hemoglobin: 13.8 g/dL (ref 11.1–15.9)
MCH: 28.6 pg (ref 26.6–33.0)
MCHC: 33.5 g/dL (ref 31.5–35.7)
MCV: 86 fL (ref 79–97)
Platelets: 333 10*3/uL (ref 150–450)
RBC: 4.82 x10E6/uL (ref 3.77–5.28)
RDW: 13 % (ref 11.7–15.4)
WBC: 7.6 10*3/uL (ref 3.4–10.8)

## 2019-01-08 LAB — COMPREHENSIVE METABOLIC PANEL
ALT: 20 IU/L (ref 0–32)
AST: 29 IU/L (ref 0–40)
Albumin/Globulin Ratio: 1.5 (ref 1.2–2.2)
Albumin: 4.6 g/dL (ref 3.8–4.8)
Alkaline Phosphatase: 72 IU/L (ref 39–117)
BUN/Creatinine Ratio: 18 (ref 12–28)
BUN: 14 mg/dL (ref 8–27)
Bilirubin Total: 0.3 mg/dL (ref 0.0–1.2)
CO2: 23 mmol/L (ref 20–29)
Calcium: 9.7 mg/dL (ref 8.7–10.3)
Chloride: 104 mmol/L (ref 96–106)
Creatinine, Ser: 0.76 mg/dL (ref 0.57–1.00)
GFR calc Af Amer: 93 mL/min/{1.73_m2} (ref >59)
GFR calc non Af Amer: 81 mL/min/{1.73_m2} (ref >59)
Globulin, Total: 3 g/dL (ref 1.5–4.5)
Glucose: 98 mg/dL (ref 65–99)
Potassium: 4.3 mmol/L (ref 3.5–5.2)
Sodium: 142 mmol/L (ref 134–144)
Total Protein: 7.6 g/dL (ref 6.0–8.5)

## 2019-01-08 LAB — HEPATIC FUNCTION PANEL: Bilirubin, Direct: 0.09 mg/dL (ref 0.00–0.40)

## 2019-01-11 ENCOUNTER — Telehealth: Payer: Self-pay | Admitting: *Deleted

## 2019-01-11 NOTE — Telephone Encounter (Signed)
Opened in error, encounter closed.

## 2019-01-12 ENCOUNTER — Telehealth: Payer: Self-pay | Admitting: *Deleted

## 2019-01-12 NOTE — Telephone Encounter (Signed)
Spoke with Rosey Bath at [facility] in [place]. CT abdomen/pelvis without contrast scheduled for 01/15/19 at 3pm [address], [place]. NPO 2 hrs prior to appointment. Do not pick up oral contrast. Will need precert, fax signed order to [phone].

## 2019-01-12 NOTE — Telephone Encounter (Signed)
Notes recorded by Leda Min, RN on 01/12/2019 at 9:01 AM EDT Spoke with patient, advised as seen below per Dr. Hyacinth Meeker. Patient request to proceed with CT of abdomen and pelvis at [facility] in [facility]. Advised patient our office will precert and return call to advise if approved. Patient verbalizes understanding and is agreeable. ------  Notes recorded by Sprague, Caroleen Hamman, RN on 01/11/2019 at 9:59 AM EDT Routing to triage. ------  Notes recorded by Jerene Bears, MD on 01/08/2019 at 3:38 PM EDT Routed original note to [place] but needs to be handled by triage. ------  Notes recorded by Jerene Bears, MD on 01/08/2019 at 3:37 PM EDT Please let pt know her lab work was all normal. She does want to proceed with CT abdomen and pelvis due to abdominal pain, bloating. If this doesn't get approved, then I will send her to GI. She does have a reaction to oral contrast so she will not drink it. Please let radiology know this when scheduling. Thanks.

## 2019-01-12 NOTE — Telephone Encounter (Signed)
Spoke with patient. Advised of appointment details as seen below. Patient agreeable to date and time. Advised patient our office will contact her once precert completed. Patient agreeable.  Confirmed with patient documented allergy to IV dye. States she has IV dye in her 30's, "unsure of reaction, was advised by surgeon not to have IV dye or contrast in future".   Written order to Dr. Hyacinth Meeker for signature.  Message to business office for precert.

## 2019-01-14 NOTE — Telephone Encounter (Signed)
Dixon, Carmel [place], RN        [place],   [place] checked with [facility] and confirmed CPT code 09628 AB/Pelvis w/o contrast does not require prior approval.   Thank you,  Suzy    Patient notified, will proceed with CT as scheduled on 01/15/19. Advised patient our office will f/u once results received and reviewed by Dr. Hyacinth Meeker. Patient agreeable.   Routing to provider for final review. Patient is agreeable to disposition. Will close encounter.

## 2019-01-19 ENCOUNTER — Telehealth: Payer: Self-pay | Admitting: *Deleted

## 2019-01-19 NOTE — Telephone Encounter (Signed)
Late entry:  Patient called Friday afternoon approximately 3:20 pm following her CT exam at [facility] in [facility]. Patient's husband is with her and is audible on speaker. She spoke to receptionist and reported being very upset regarding CT exam and insisted someone call her back.   Call back to patient with Webb Silversmith ( patient spoke to [facility] initially) in room. Patient reported her frustartion regarding experience at imaging center.   Her experience today was not like her husbands experience with facility for same test.  Very upset that we did not offer to give her the alternative contrast that they use and that she may have paid a $100 copay for a test that was not beneficial.  Advised that imaging center is not a [facility] facility that we use frequently is not a cost effective to keep contrast in every office.plus, instructions on use have to be provided by facility.  Discussed waiting for results to determine effectivness and is repeat exam with contrast is actually needed.  Due to her allergy, we have in notes she was not to receive either IV or oral contrast.  Will need to determine if alternative is needed.  Patient also felt rushed during lunch hour at facility.  Discussed that we will review with Dr Hyacinth Meeker and call her as soon as possible. Support give regarding experience.  Call time 20 minutes.   Routing to Dr Hyacinth Meeker.

## 2019-01-21 ENCOUNTER — Other Ambulatory Visit: Payer: Self-pay | Admitting: Obstetrics & Gynecology

## 2019-01-21 ENCOUNTER — Telehealth: Payer: Self-pay | Admitting: *Deleted

## 2019-01-21 NOTE — Telephone Encounter (Signed)
Dr. Hyacinth Meeker -01/16/19 CT abdomen/pelvis results available in Care Everywhere, please review and advise.

## 2019-01-25 ENCOUNTER — Other Ambulatory Visit: Payer: Self-pay | Admitting: Obstetrics & Gynecology

## 2019-01-25 MED ORDER — HYOSCYAMINE SULFATE 0.125 MG PO TABS: 0.1250 mg | ORAL_TABLET | ORAL | 0 refills | Status: DC | PRN

## 2019-01-25 NOTE — Telephone Encounter (Signed)
Patient wanting to confirm that a medication was going to be called in after she received ct results.

## 2019-01-25 NOTE — Telephone Encounter (Signed)
Rx for Levsin 0.125mg  po up to 4 times daily as needed for abdominal pain/bloating.  This is just a trial.  When she and I spoke last week, I really thought I'd sent this in electronically.  Please apologize for me that she had to call back.  Thanks.

## 2019-01-25 NOTE — Telephone Encounter (Signed)
Spoke with patient, advised as seen below per Dr. Miller. Patient verbalizes understanding and is agreeable.   Encounter closed.  

## 2019-01-25 NOTE — Progress Notes (Signed)
Levsin rx sent to pharmacy.

## 2019-02-02 ENCOUNTER — Encounter: Payer: Self-pay | Admitting: Obstetrics & Gynecology

## 2019-04-06 ENCOUNTER — Encounter: Payer: Self-pay | Admitting: Obstetrics & Gynecology

## 2019-07-26 ENCOUNTER — Other Ambulatory Visit: Payer: Self-pay

## 2019-07-27 ENCOUNTER — Ambulatory Visit (INDEPENDENT_AMBULATORY_CARE_PROVIDER_SITE_OTHER): Payer: Medicare Other | Admitting: Obstetrics & Gynecology

## 2019-07-27 ENCOUNTER — Telehealth: Payer: Self-pay | Admitting: *Deleted

## 2019-07-27 ENCOUNTER — Encounter: Payer: Self-pay | Admitting: Obstetrics & Gynecology

## 2019-07-27 VITALS — BP 116/60 | HR 80 | Temp 97.7°F | Resp 12 | Ht 63.5 in | Wt 140.6 lb

## 2019-07-27 DIAGNOSIS — Z01419 Encounter for gynecological examination (general) (routine) without abnormal findings: Secondary | ICD-10-CM | POA: Diagnosis not present

## 2019-07-27 NOTE — Telephone Encounter (Signed)
-----   Message from Megan Salon, MD sent at 07/27/2019  3:26 PM EST ----- Regarding: BMD Kayla Savage, Can you please schedule BMD for this pt.  She has this done in [place] where she does her MMGs.  I think they will need an order.  Any day or time works for her.  Thanks.  Vinnie Level

## 2019-07-27 NOTE — Progress Notes (Signed)
68 y.o. G105P2002 Married Other or two or more races female here for annual exam.  Doing well.  Denies vaginal bleeding.    Declines flu shot.    Having a lot more left knee pain.  Patient's last menstrual period was 11/23/2004.          Sexually active: No.  The current method of family planning is post menopausal status.    Exercising: No.  The patient does not participate in regular exercise at present. Smoker:  no  Health Maintenance: Pap:  01/09/17 Neg              11/04/14 neg  History of abnormal Pap:  Yes, remote hx MMG:  04/06/19 BIRADS 1 negative/density b Colonoscopy:  05/29/12 f/u 10 years.  Interested in cologuard when screening is due BMD:   06/05/17 Osteoporosis.  t score -3.4.  This was worse than imaging in 2016. TDaP:  10/2009.  Aware this is near being due. Pneumonia vaccine(s):  Completed  Shingrix:   Zoster 07/03/12 Hep C testing: 07/24/16 Negative Screening Labs: CBC, CMP, and hepatic panel were negative 12/2018    reports that she has never smoked. She has never used smokeless tobacco. She reports that she does not drink alcohol or use drugs.  Past Medical History:  Diagnosis Date  . Broken finger 01/01/14   fell-left side  . Eye injury    cut with nail.   Marland Kitchen Hearing loss   . Nose fracture 07/2016   due to fall   . Osteoporosis     Past Surgical History:  Procedure Laterality Date  . FACIAL COSMETIC SURGERY    . NOSE SURGERY  10/02/2016   due to injury  . TONSILLECTOMY      Current Outpatient Medications  Medication Sig Dispense Refill  . aspirin-acetaminophen-caffeine (EXCEDRIN MIGRAINE) 250-250-65 MG per tablet Take 1 tablet by mouth every 6 (six) hours as needed for pain.    . calcium carbonate (CALCIUM 600) 600 MG TABS tablet Take by mouth daily.    . hyoscyamine (LEVSIN) 0.125 MG tablet Take 1 tablet (0.125 mg total) by mouth every 4 (four) hours as needed. 30 tablet 0  . Multiple Vitamin (MULTIVITAMIN) tablet Take 1 tablet by mouth daily.    . NON  FORMULARY Shaklee Osteomatric     No current facility-administered medications for this visit.     Family History  Problem Relation Age of Onset  . Breast cancer Maternal Grandmother   . Heart disease Other   . Parkinson's disease Mother   . Heart disease Father     Review of Systems  All other systems reviewed and are negative.   Exam:   BP 116/60 (BP Location: Right Arm, Patient Position: Sitting, Cuff Size: Normal)   Pulse 80   Temp 97.7 F (36.5 C) (Temporal)   Resp 12   Ht 5' 3.5" (1.613 m)   Wt 140 lb 9.6 oz (63.8 kg)   LMP 11/23/2004   BMI 24.52 kg/m     Height: 5' 3.5" (161.3 cm)  Ht Readings from Last 3 Encounters:  07/27/19 5' 3.5" (1.613 m)  03/27/18 5' 3.5" (1.613 m)  01/09/17 5\' 4"  (1.626 m)    General appearance: alert, cooperative and appears stated age Head: Normocephalic, without obvious abnormality, atraumatic Neck: no adenopathy, supple, symmetrical, trachea midline and thyroid normal to inspection and palpation Lungs: clear to auscultation bilaterally Breasts: normal appearance, no masses or tenderness Heart: regular rate and rhythm Abdomen: soft, non-tender; bowel sounds  normal; no masses,  no organomegaly Extremities: extremities normal, atraumatic, no cyanosis or edema Skin: Skin color, texture, turgor normal. No rashes or lesions Lymph nodes: Cervical, supraclavicular, and axillary nodes normal. No abnormal inguinal nodes palpated Neurologic: Grossly normal   Pelvic: External genitalia:  no lesions              Urethra:  normal appearing urethra with no masses, tenderness or lesions              Bartholins and Skenes: normal                 Vagina: normal appearing vagina with normal color and discharge, no lesions              Cervix: no lesions              Pap taken: No. Bimanual Exam:  Uterus:  normal size, contour, position, consistency, mobility, non-tender              Adnexa: normal adnexa and no mass, fullness, tenderness                Rectovaginal: Confirms               Anus:  normal sphincter tone, no lesions  Chaperone was present for exam.  A:  Well Woman with normal exam PMP, no HRT Osteoporosis.  Received Prolia injections x 2.  Stopped due to side effects H/O depression Umbilical hernia  P:   Mammogram guidelines reviewed pap smear neg 2020 Declines doing blood work today BMD will be scheduled for pt Colonoscopy is UTD Vaccines UTD except flu shot.  She declines having this today. Return annually or prn

## 2019-07-27 NOTE — Patient Instructions (Signed)
[facility] [address] [place],  [place]  [address]  Main: [phone]  Sunday:Closed Monday:7:00 AM - 7:00 PM Tuesday:7:00 AM - 7:00 PM Wednesday:7:00 AM - 5:00 PM Thursday:7:00 AM - 7:00 PM Friday:7:00 AM - 5:00 PM  Dr. Anderson [place]

## 2019-07-27 NOTE — Telephone Encounter (Signed)
Written order for BMD to Dr. Sabra Heck for signature.   Previous BMD [facility]  (p) [phone]  Spoke with Lattie Haw. Patient scheduled for 08/02/19 at 2pm. Fax order to [phone].   Call placed to patient, no answer, no option to leave voicemail.

## 2019-07-28 NOTE — Telephone Encounter (Signed)
Call placed to patient, no answer. Left detailed message, ok per dpr. Advised of BMD appt. Contact [facility] directly if any questions about appt date/time, return call to office if any additional questions.   Routing to provider for final review. Patient is agreeable to disposition. Will close encounter.

## 2019-08-18 ENCOUNTER — Telehealth: Payer: Self-pay

## 2019-08-18 NOTE — Telephone Encounter (Signed)
Discussed in detail with patient Bone Density results. Per Dr. Sabra Heck, schedule patient for video visit to discuss results. Patient states that she will call our office back in about an hour to schedule. Routing to front desk to schedule patient when she calls back.

## 2019-08-18 NOTE — Telephone Encounter (Signed)
Patient scheduled MyChart visit with Dr. Sabra Heck for 08/30/2019 at 1pm to discuss Bone Density results. Encounter closed.

## 2019-08-30 ENCOUNTER — Telehealth (INDEPENDENT_AMBULATORY_CARE_PROVIDER_SITE_OTHER): Payer: Medicare PPO | Admitting: Obstetrics & Gynecology

## 2019-08-30 ENCOUNTER — Encounter: Payer: Self-pay | Admitting: Obstetrics & Gynecology

## 2019-08-30 DIAGNOSIS — M810 Age-related osteoporosis without current pathological fracture: Secondary | ICD-10-CM | POA: Diagnosis not present

## 2019-08-30 NOTE — Progress Notes (Signed)
Subjective:  Virtual Visit via Video Note  I connected with Benjaman Pott on 08/30/19 at  1:00 PM EST by a video enabled telemedicine application and verified that I am speaking with the correct person using two identifiers.  Location: Patient: home Provider: office  48 yrs Married Caucasian G2P2002  female for video visit to discuss recent BMD obtained 08/02/2019 showing osteoporosis with T score -3.9 in her spine and -3.2 left femur.   She has been on Fosamax, Actonel, and Evista.  She's taken two Prolia injections.  After the second injection, she started having body aches and leg pain as well as tachycardia.  She decided not to receive a third injection and to not continue with any medications.  We have reviewed Reclast in the past.  She is apprehensive about using Reclast due to the length of time this medication works.  She has not used Forteo.    Osteoporosis Risk Factors  Nonmodifiable Personal Hx of fracture as an adult: yes - finger (tripped in parking lot on speed bump) and nose (with fall) Hx of fracture in first-degree relative: mother has hx of osteoporosis but never had a fracture Caucasian race: yes Advanced age: >46 Female sex: yes Dementia: no Poor health/frailty: no  Potentially modifiable: Tobacco use: no Low body weight (<127 lbs): no Estrogen deficiency  early menopause (age <45) or bilateral ovariectomy: no  prolonged premenopausal amenorrhea (>1 yr): no Low calcium intake (lifelong): yes, takes osteomatrix from [facility] and calcium  Alcohol use more than 2 drinks per day: no Recurrent falls: no Inadequate physical activity: yes due to knee pain.  She is not really exercising much at this time due to knee pain.  Current calcium and Vit D intake:  1000mg  calcium and Vit D 2000 IU  Review of Systems A comprehensive review of systems was negative except for: back pain     Objective:   PHYSICAL EXAM LMP 11/23/2004  General appearance: alert, cooperative  and no distress  Imaging Bone Density: Spine T Score: -3.9, Hip T Score: -3.2 done on 08/02/2019  FRAX score:  Not calculated                                 Assessment:   Osteoporosis with T score -3.9   Plan:   1.  Considering all of the options that she's taken, we do not have a lot of options.  Reclast and Forteo are her options.  She wants a medication that she could stop if she does have side effects.  Using Reclast does make her nervous as it is dosed only yearly.  Reviewed with pt mechanism of action of medications and that Reclast is out of her system typically within 24 hours.   Will refer to Dr. 14/02/2019 for additional discussion as pt it very reluctant to taking any medication for her osteoporosis.  2.  Patient counseled in adequate calcium and vitamin D exposure.  Calcium - 500 - 1000 mg elemental calcium/day in divided doses  Vitamin D - 800 IU/day  She is getting adequate of both of these.  3.  Exercise guidelines reviewed.  She is having trouble with this due to knee pain.  She will try to improve this.    ~32 minutes spent with patient >50% of time was in face to face discussion of above.

## 2019-09-29 ENCOUNTER — Ambulatory Visit: Payer: Medicare Other | Admitting: Internal Medicine

## 2020-02-17 ENCOUNTER — Telehealth: Payer: Self-pay

## 2020-02-17 NOTE — Telephone Encounter (Signed)
Call transferred from front office staff. Spoke with patient. Patient is A/Ox4. Speech is slurred. Reports sharp pain down right arm. Numbness on right side of face,  right arm and right lower extremity. Symptoms started on 02/15/20. Patient reports she "just does not feel well", requesting a referral to a specialist. Does not have a PCP.   Advised patient to go directly to ER now or call 911. Patient declined. Advised patient referral to specialist can take days or weeks for appt. Advised patient to avoid delay in care and/or treatment ER is the best place for evaluation. Patient again declined. Patient states, "I am not going to the ER, my mom died of a stroke, I will just let it be". Reports her sister encouraged her to call and her husband is concerned. Again, advised patient to go directly to ER now, RN offered to call 911. Call disconnected by patient.    Call reviewed with Dr. Hyacinth Meeker. ER recommended for evaluation.   Routing to provider for final review.

## 2020-03-13 ENCOUNTER — Encounter: Payer: Self-pay | Admitting: Family Medicine

## 2020-03-13 ENCOUNTER — Ambulatory Visit: Payer: Medicare PPO | Admitting: Family Medicine

## 2020-03-13 ENCOUNTER — Other Ambulatory Visit: Payer: Self-pay

## 2020-03-13 VITALS — BP 124/80 | HR 64 | Temp 97.5°F | Ht 64.0 in | Wt 145.0 lb

## 2020-03-13 DIAGNOSIS — H9313 Tinnitus, bilateral: Secondary | ICD-10-CM | POA: Diagnosis not present

## 2020-03-13 DIAGNOSIS — Z0000 Encounter for general adult medical examination without abnormal findings: Secondary | ICD-10-CM | POA: Diagnosis not present

## 2020-03-13 DIAGNOSIS — R531 Weakness: Secondary | ICD-10-CM | POA: Diagnosis not present

## 2020-03-13 DIAGNOSIS — R519 Headache, unspecified: Secondary | ICD-10-CM

## 2020-03-13 DIAGNOSIS — H9319 Tinnitus, unspecified ear: Secondary | ICD-10-CM | POA: Insufficient documentation

## 2020-03-13 DIAGNOSIS — Z1159 Encounter for screening for other viral diseases: Secondary | ICD-10-CM

## 2020-03-13 NOTE — Progress Notes (Signed)
Chief Complaint  Patient presents with  . New Patient (Initial Visit)       New Patient Visit SUBJECTIVE: HPI: Kayla Savage is an 69 y.o.female who is being seen for establishing care.  The patient was previously seen at another office where pcp passed away.  2023/03/09, had an episode where she had a sharp pain that went across her R chest. No inj or change in activity. It lasted for a brief second and then went away. R side of head has hurt and has been sensitive since that time.  She has gotten intermittent sharp chest pain to lesser degree that is not brought on by exertion. +RUE weakness. No neck pain, fevers, difficulty swallowing, trouble with speech, balance issues, or sob.  She has no history of diabetes, high blood pressure, stroke, or heart attack.  She does have a history of borderline high cholesterol.  Past Medical History:  Diagnosis Date  . Hearing loss   . Osteoporosis   . Tinnitus    Past Surgical History:  Procedure Laterality Date  . FACIAL COSMETIC SURGERY    . NOSE SURGERY  10/02/2016   due to injury  . TONSILLECTOMY     Family History  Problem Relation Age of Onset  . Breast cancer Maternal Grandmother   . Heart disease Other   . Parkinson's disease Mother   . Stroke Mother   . Heart disease Father    Allergies  Allergen Reactions  . Boniva [Ibandronic Acid]   . Evista [Raloxifene]   . Lexapro [Escitalopram Oxalate]     Fatigue, dizzy, insomnia  . Other     IV Dye  . Prolia [Denosumab]     Current Outpatient Medications:  .  aspirin-acetaminophen-caffeine (EXCEDRIN MIGRAINE) 250-250-65 MG per tablet, Take 1 tablet by mouth every 6 (six) hours as needed for pain., Disp: , Rfl:  .  calcium carbonate (CALCIUM 600) 600 MG TABS tablet, Take by mouth daily., Disp: , Rfl:  .  Multiple Vitamin (MULTIVITAMIN) tablet, Take 1 tablet by mouth daily., Disp: , Rfl:  .  NON FORMULARY, Shaklee Osteomatric, Disp: , Rfl:   OBJECTIVE: BP 124/80 (BP Location: Right  Arm, Patient Position: Sitting, Cuff Size: Normal)   Pulse 64   Temp (!) 97.5 F (36.4 C) (Oral)   Ht 5\' 4"  (1.626 m)   Wt 145 lb (65.8 kg)   LMP 11/23/2004   SpO2 99%   BMI 24.89 kg/m  General:  well developed, well nourished, in no apparent distress Skin:  no significant moles, warts, or growths Lungs:  clear to auscultation, breath sounds equal bilaterally, no respiratory distress Cardio:  regular rate and rhythm, no LE edema or bruits Musculoskeletal:  symmetrical muscle groups noted without atrophy or deformity Neuro:  gait normal, 4/5 strength with right hip flexion, right elbow flexion, and right shoulder external rotation; 5/5 strength in all of the domains and on the left side as well.  DTRs equal and symmetric throughout, no clonus, no cerebellar signs  Psych: well oriented with normal range of affect and appropriate judgment/insight  ASSESSMENT/PLAN: Right-sided headache - Plan: MR Brain Wo Contrast  Right sided weakness - Plan: MR Brain Wo Contrast  Tinnitus of both ears  Well adult exam - Plan: CBC, Comprehensive metabolic panel, Lipid panel  Encounter for hepatitis C screening test for low risk patient - Plan: Hepatitis C antibody  1/2-check imaging.  If normal, will look into chronic daily headache type treatment. 3-she has seen audiology and ENT, no  further work-up.  It is constant and bilateral, no red flag signs. Patient should return in 1 month for her physical, we will check labs today. The patient voiced understanding and agreement to the plan.   Jilda Roche [place], DO 03/13/20  2:35 PM

## 2020-03-13 NOTE — Patient Instructions (Signed)
Someone should reach out in the next week or so. We will be in touch once the results are back.   Keep the diet clean and stay active.  Give [place] 2-3 business days to get the results of your labs back.   Let us know if you need anything.

## 2020-03-14 ENCOUNTER — Other Ambulatory Visit: Payer: Self-pay | Admitting: Family Medicine

## 2020-03-14 DIAGNOSIS — E785 Hyperlipidemia, unspecified: Secondary | ICD-10-CM

## 2020-03-14 LAB — COMPREHENSIVE METABOLIC PANEL
ALT: 14 U/L (ref 0–35)
AST: 22 U/L (ref 0–37)
Albumin: 4.4 g/dL (ref 3.5–5.2)
Alkaline Phosphatase: 59 U/L (ref 39–117)
BUN: 14 mg/dL (ref 6–23)
CO2: 27 mEq/L (ref 19–32)
Calcium: 9.8 mg/dL (ref 8.4–10.5)
Chloride: 101 mEq/L (ref 96–112)
Creatinine, Ser: 0.75 mg/dL (ref 0.40–1.20)
GFR: 76.54 mL/min (ref >60.00)
Glucose, Bld: 83 mg/dL (ref 70–99)
Potassium: 4.2 mEq/L (ref 3.5–5.1)
Sodium: 135 mEq/L (ref 135–145)
Total Bilirubin: 0.6 mg/dL (ref 0.2–1.2)
Total Protein: 7.2 g/dL (ref 6.0–8.3)

## 2020-03-14 LAB — HEPATITIS C ANTIBODY
Hepatitis C Ab: NONREACTIVE
SIGNAL TO CUT-OFF: 0.05 (ref <1.00)

## 2020-03-14 LAB — LIPID PANEL
Cholesterol: 200 mg/dL (ref 0–200)
HDL: 58.2 mg/dL (ref >39.00)
LDL Cholesterol: 118 mg/dL — ABNORMAL HIGH (ref 0–99)
NonHDL: 142.07
Total CHOL/HDL Ratio: 3
Triglycerides: 122 mg/dL (ref 0.0–149.0)
VLDL: 24.4 mg/dL (ref 0.0–40.0)

## 2020-03-14 LAB — CBC
HCT: 39.5 % (ref 36.0–46.0)
Hemoglobin: 13.1 g/dL (ref 12.0–15.0)
MCHC: 33.2 g/dL (ref 30.0–36.0)
MCV: 87 fl (ref 78.0–100.0)
Platelets: 286 10*3/uL (ref 150.0–400.0)
RBC: 4.54 Mil/uL (ref 3.87–5.11)
RDW: 13.7 % (ref 11.5–15.5)
WBC: 8.6 10*3/uL (ref 4.0–10.5)

## 2020-03-14 MED ORDER — DIAZEPAM 5 MG PO TABS: ORAL_TABLET | ORAL | 0 refills | Status: DC

## 2020-03-18 ENCOUNTER — Other Ambulatory Visit: Payer: Self-pay

## 2020-03-18 ENCOUNTER — Ambulatory Visit (HOSPITAL_BASED_OUTPATIENT_CLINIC_OR_DEPARTMENT_OTHER)
Admission: RE | Admit: 2020-03-18 | Discharge: 2020-03-18 | Disposition: A | Discharge status: Home / Self Care | Payer: Medicare PPO | Source: Ambulatory Visit | Attending: Family Medicine | Admitting: Family Medicine

## 2020-03-18 DIAGNOSIS — R519 Headache, unspecified: Secondary | ICD-10-CM | POA: Insufficient documentation

## 2020-03-18 DIAGNOSIS — R531 Weakness: Secondary | ICD-10-CM | POA: Insufficient documentation

## 2020-04-17 ENCOUNTER — Ambulatory Visit (INDEPENDENT_AMBULATORY_CARE_PROVIDER_SITE_OTHER): Payer: Medicare PPO | Admitting: Family Medicine

## 2020-04-17 ENCOUNTER — Encounter: Payer: Self-pay | Admitting: Family Medicine

## 2020-04-17 ENCOUNTER — Other Ambulatory Visit: Payer: Self-pay

## 2020-04-17 VITALS — BP 120/76 | HR 67 | Temp 98.0°F | Ht 64.0 in | Wt 144.4 lb

## 2020-04-17 DIAGNOSIS — I6782 Cerebral ischemia: Secondary | ICD-10-CM

## 2020-04-17 DIAGNOSIS — Z0000 Encounter for general adult medical examination without abnormal findings: Secondary | ICD-10-CM | POA: Diagnosis not present

## 2020-04-17 DIAGNOSIS — E7800 Pure hypercholesterolemia, unspecified: Secondary | ICD-10-CM | POA: Diagnosis not present

## 2020-04-17 MED ORDER — ROSUVASTATIN CALCIUM 10 MG PO TABS: 10.0000 mg | ORAL_TABLET | Freq: Every day | ORAL | 2 refills | Status: DC

## 2020-04-17 NOTE — Progress Notes (Signed)
Chief Complaint  Patient presents with  . Annual Exam     Well Woman Kayla Savage is here for a complete physical.   Her last physical was >1 year ago.  Current diet: in general, diet is fair. Current exercise: walking. Weight is stable and she denies daytime fatigue. Seatbelt? Yes  Health Maintenance Colonoscopy- Yes Shingrix- No DEXA- Yes Mammogram- Yes Tetanus- No Pneumonia- Yes Hep C screen- Yes  Past Medical History:  Diagnosis Date  . Hearing loss   . Osteoporosis   . Tinnitus      Past Surgical History:  Procedure Laterality Date  . FACIAL COSMETIC SURGERY    . NOSE SURGERY  10/02/2016   due to injury  . TONSILLECTOMY      Medications  Current Outpatient Medications on File Prior to Visit  Medication Sig Dispense Refill  . aspirin-acetaminophen-caffeine (EXCEDRIN MIGRAINE) 250-250-65 MG per tablet Take 1 tablet by mouth every 6 (six) hours as needed for pain.    . calcium carbonate (CALCIUM 600) 600 MG TABS tablet Take by mouth daily.    . Multiple Vitamin (MULTIVITAMIN) tablet Take 1 tablet by mouth daily.    . NON FORMULARY Shaklee Osteomatric      Allergies Allergies  Allergen Reactions  . Boniva [Ibandronic Acid]   . Evista [Raloxifene]   . Lexapro [Escitalopram Oxalate]     Fatigue, dizzy, insomnia  . Other     IV Dye  . Prolia [Denosumab]     Review of Systems: Constitutional:  no fevers Eye:  no recent significant change in vision Ears:  No changes in hearing Nose/Mouth/Throat:  no complaints of nasal congestion, no sore throat Cardiovascular: no chest pain Respiratory:  No shortness of breath Gastrointestinal:  No change in bowel habits GU:  Female: negative for dysuria Integumentary:  no abnormal skin lesions reported Neurologic:  no weakness Endocrine:  denies unexplained weight changes  Exam BP 120/76 (BP Location: Left Arm, Patient Position: Sitting, Cuff Size: Normal)   Pulse 67   Temp 98 F (36.7 C) (Oral)   Ht 5\' 4"   (1.626 m)   Wt 144 lb 6 oz (65.5 kg)   LMP 11/23/2004   SpO2 100%   BMI 24.78 kg/m  General:  well developed, well nourished, in no apparent distress Skin:  no significant moles, warts, or growths Head:  no masses, lesions, or tenderness Eyes:  pupils equal and round, sclera anicteric without injection Ears:  canals without lesions, TMs shiny without retraction, no obvious effusion, no erythema Nose:  nares patent, septum midline, mucosa normal, and no drainage or sinus tenderness Throat/Pharynx:  lips and gingiva without lesion; tongue and uvula midline; non-inflamed pharynx; no exudates or postnasal drainage Neck: neck supple without adenopathy, thyromegaly, or masses Lungs:  clear to auscultation, breath sounds equal bilaterally, no respiratory distress Cardio:  regular rate and rhythm, no bruits or LE edema Abdomen:  abdomen soft, nontender; bowel sounds normal; no masses or organomegaly Genital: Deferred Neuro:  gait normal; deep tendon reflexes normal and symmetric Psych: well oriented with normal range of affect and appropriate judgment/insight  Assessment and Plan  Well adult exam  Pure hypercholesterolemia - Plan: rosuvastatin (CRESTOR) 10 MG tablet  Ischemic cerebrovascular disease - Plan: rosuvastatin (CRESTOR) 10 MG tablet   Well 69 y.o. female. Counseled on diet and exercise. Other orders as above. Start statin given ischemic changes seen on MRI.  Failed Zoloft and possibly Paxil. Did not do well with Wellbutrin. Offered new med, psychiatry, and counseling/therapy  but she declined at this time. I do not believe she is an immediate threat to herself or others.  Follow up in 2 mo for labs, 6 mo for med ck. The patient voiced understanding and agreement to the plan.  Jilda Roche [place], DO 04/17/20 12:02 PM

## 2020-04-17 NOTE — Patient Instructions (Addendum)
Keep the diet clean and stay active.  Aim to do some physical exertion for 150 minutes per week. This is typically divided into 5 days per week, 30 minutes per day. The activity should be enough to get your heart rate up. Anything is better than nothing if you have time constraints. Consider weight resistance exercise with bands, light weight and/or yoga.   Look on YouTube for instructional videos.   The new Shingrix vaccine (for shingles) is a 2 shot series. It can make people feel low energy, achy and almost like they have the flu for 48 hours after injection. Please plan accordingly when deciding on when to get this shot. Call our office for a nurse visit appointment to get this. The second shot of the series is less severe regarding the side effects, but it still lasts 48 hours.   Let me know if you are interested in starting a medication or seeing a specialist for stress/mood issues.  Let us know if you need anything.  Coping skills Choose 5 that work for you:  Take a deep breath  Count to 20  Read a book  Do a puzzle  Meditate  Bake  Sing  Knit  Garden  Pray  Go outside  Call a friend  Listen to music  Take a walk  Color  Send a note  Take a bath  Watch a movie  Be alone in a quiet place  Pet an animal  Visit a friend  Journal  Exercise  Stretch

## 2020-06-16 NOTE — Addendum Note (Signed)
Addended by: Mervin Kung A on: 06/16/2020 08:47 AM   Modules accepted: Orders

## 2020-06-19 ENCOUNTER — Other Ambulatory Visit: Payer: Self-pay

## 2020-06-19 ENCOUNTER — Other Ambulatory Visit (INDEPENDENT_AMBULATORY_CARE_PROVIDER_SITE_OTHER): Payer: Medicare PPO

## 2020-06-19 DIAGNOSIS — E785 Hyperlipidemia, unspecified: Secondary | ICD-10-CM | POA: Diagnosis not present

## 2020-06-19 LAB — LIPID PANEL
Cholesterol: 218 mg/dL — ABNORMAL HIGH (ref <200)
HDL: 62 mg/dL (ref >=50)
LDL Cholesterol (Calc): 131 mg/dL (calc) — ABNORMAL HIGH
Non-HDL Cholesterol (Calc): 156 mg/dL (calc) — ABNORMAL HIGH (ref <130)
Total CHOL/HDL Ratio: 3.5 (calc) (ref <5.0)
Triglycerides: 137 mg/dL (ref <150)

## 2020-10-10 ENCOUNTER — Ambulatory Visit: Payer: Medicare Other

## 2020-10-18 ENCOUNTER — Ambulatory Visit: Payer: Medicare PPO | Admitting: Family Medicine

## 2020-10-18 ENCOUNTER — Encounter: Payer: Self-pay | Admitting: Family Medicine

## 2020-10-18 ENCOUNTER — Other Ambulatory Visit: Payer: Self-pay

## 2020-10-18 VITALS — BP 100/70 | HR 84 | Temp 97.9°F | Ht 64.0 in | Wt 147.2 lb

## 2020-10-18 DIAGNOSIS — G8929 Other chronic pain: Secondary | ICD-10-CM

## 2020-10-18 DIAGNOSIS — M546 Pain in thoracic spine: Secondary | ICD-10-CM

## 2020-10-18 DIAGNOSIS — E7800 Pure hypercholesterolemia, unspecified: Secondary | ICD-10-CM | POA: Insufficient documentation

## 2020-10-18 DIAGNOSIS — F4323 Adjustment disorder with mixed anxiety and depressed mood: Secondary | ICD-10-CM | POA: Diagnosis not present

## 2020-10-18 NOTE — Progress Notes (Signed)
Chief Complaint  Patient presents with  . Follow-up    Head pain   . Anxiety    Subjective: Hyperlipidemia Patient presents for Hyperlipidemia follow up. Currently taking Crestor 10 mg/d and compliance with treatment thus far has been good. She denies myalgias. She is adhering to a healthy diet. Exercise: walking The patient is not known to have coexisting coronary artery disease.  Anxiety Has been taking care of her elderly father who is starting to deteriorate. This has caused a lot stress and tearfulness. She does not follow w a counselor and has no desire to after her most recent one fell asleep on her.   Mid back pain for several mo. No inj or change in activity. Also having b/l back pain that is lower.  She is interested in seeing physical therapy.  Her husband sees a group across the street from [facility] that she is interested in going to.  Past Medical History:  Diagnosis Date  . Hearing loss   . Osteoporosis   . Tinnitus     Objective: BP 100/70 (BP Location: Left Arm, Patient Position: Sitting, Cuff Size: Normal)   Pulse 84   Temp 97.9 F (36.6 C) (Oral)   Ht 5\' 4"  (1.626 m)   Wt 147 lb 4 oz (66.8 kg)   LMP 11/23/2004   SpO2 97%   BMI 25.28 kg/m  General: Awake, appears stated age HEENT: MMM MSK: Mild tenderness to palpation in the lower/mid thoracic spine and lumbar paraspinal musculature Neuro: Gait is normal, no cerebellar signs Heart: RRR, no LE edema, no bruits Lungs: CTAB, no rales, wheezes or rhonchi. No accessory muscle use Psych: Age appropriate judgment and insight, normal affect and mood  Assessment and Plan: Pure hypercholesterolemia  Chronic midline thoracic back pain - Plan: Ambulatory referral to Physical Therapy  Situational mixed anxiety and depressive disorder  1.  Continue Crestor 5 mg daily.  Counseled on diet and exercise. 2.  We will get her set up with physical therapy. 3.  I gave her counseling information  which she will likely not pursue.  We discussed medication and I recommend going back on Wellbutrin with the option of an as needed medication.  She declined this offer at this time and wishes to think about it.  She will send me a MyChart message if she would like to start a medication.  We will follow-up pending initiation of said medication. F/u in 6 months for physical otherwise. The patient voiced understanding and agreement to the plan.  11/25/2004 [place], DO 10/18/20  10:57 AM

## 2020-10-18 NOTE — Patient Instructions (Addendum)
Keep the diet clean and stay active.  Aim to do some physical exertion for 150 minutes per week. This is typically divided into 5 days per week, 30 minutes per day. The activity should be enough to get your heart rate up. Anything is better than nothing if you have time constraints.  If you do not hear anything about your referral in the next 1-2 weeks, call our office and ask for an update.  Please consider counseling. Contact [phone] to schedule an appointment or inquire about cost/insurance coverage.  Heat (pad or rice pillow in microwave) over affected area, 10-15 minutes twice daily.   Ice/cold pack over area for 10-15 min twice daily.  Send me a message if you decide to start a medication.  Let us know if you need anything.

## 2020-12-12 ENCOUNTER — Encounter (HOSPITAL_BASED_OUTPATIENT_CLINIC_OR_DEPARTMENT_OTHER): Payer: Self-pay | Admitting: Obstetrics & Gynecology

## 2020-12-12 ENCOUNTER — Ambulatory Visit (INDEPENDENT_AMBULATORY_CARE_PROVIDER_SITE_OTHER): Payer: Medicare PPO | Admitting: Obstetrics & Gynecology

## 2020-12-12 ENCOUNTER — Other Ambulatory Visit: Payer: Self-pay

## 2020-12-12 VITALS — BP 128/87 | HR 72 | Ht 63.0 in | Wt 147.0 lb

## 2020-12-12 DIAGNOSIS — Z01419 Encounter for gynecological examination (general) (routine) without abnormal findings: Secondary | ICD-10-CM | POA: Diagnosis not present

## 2020-12-12 DIAGNOSIS — K429 Umbilical hernia without obstruction or gangrene: Secondary | ICD-10-CM

## 2020-12-12 DIAGNOSIS — Z780 Asymptomatic menopausal state: Secondary | ICD-10-CM | POA: Diagnosis not present

## 2020-12-12 DIAGNOSIS — M810 Age-related osteoporosis without current pathological fracture: Secondary | ICD-10-CM

## 2020-12-12 DIAGNOSIS — F339 Major depressive disorder, recurrent, unspecified: Secondary | ICD-10-CM | POA: Diagnosis not present

## 2020-12-12 NOTE — Progress Notes (Signed)
70 y.o. G79P2002 Married White or Caucasian female here for breast and pelvic exam.  Denies vaginal bleeding.  Father is 74 and still lives alone.  This causes her a lot of stress.  Sister helps her dad.  Reports she does have some intermittent breast pain.  Can be sharp at times.  No nipple discharge.  Pt asks for prescription for anti-anxiety medication to use prn.  She does not want to be on anything daily.  I am concerned about developing tolerance due to long term anxiety issues.  She declined anything other than prn.    Patient's last menstrual period was 11/23/2004.          Sexually active: No.  H/O STD:  no  Health Maintenance: PCP:  Dr. Carmelia Roller.  Last wellness appt was 04/17/2020.  Did blood work at that appt:  Done in July, 2021 Vaccines are up to date:  yes Colonoscopy:  05/29/2012, follow up 10 years MMG:  05/03/2020 BMD:  08/02/2019, osteoporosis Last pap smear:  01/09/2017.   H/o abnormal pap smear:  Yes, remote yx   reports that she has never smoked. She has never used smokeless tobacco. She reports that she does not drink alcohol and does not use drugs.  Past Medical History:  Diagnosis Date  . Hearing loss   . Osteoporosis   . Tinnitus     Past Surgical History:  Procedure Laterality Date  . FACIAL COSMETIC SURGERY    . NOSE SURGERY  10/02/2016   due to injury  . TONSILLECTOMY      Current Outpatient Medications  Medication Sig Dispense Refill  . aspirin-acetaminophen-caffeine (EXCEDRIN MIGRAINE) 250-250-65 MG per tablet Take 1 tablet by mouth every 6 (six) hours as needed for pain.    . calcium carbonate (OS-CAL) 600 MG TABS tablet Take by mouth daily.    . Multiple Vitamin (MULTIVITAMIN) tablet Take 1 tablet by mouth daily.    . NON FORMULARY Shaklee Osteomatric    . rosuvastatin (CRESTOR) 10 MG tablet Take 1 tablet (10 mg total) by mouth daily. 90 tablet 2   No current facility-administered medications for this visit.    Family History  Problem Relation  Age of Onset  . Breast cancer Maternal Grandmother   . Heart disease Other   . Parkinson's disease Mother   . Stroke Mother   . Heart disease Father     Review of Systems  Endocrine:       Breast pain, intermittent    Exam:   BP 128/87   Pulse 72   Ht 5\' 3"  (1.6 m)   Wt 147 lb (66.7 kg)   LMP 11/23/2004   BMI 26.04 kg/m   Height: 5\' 3"  (160 cm)  General appearance: alert, cooperative and appears stated age Breasts: normal appearance, no masses or tenderness Abdomen: soft, non-tender; bowel sounds normal; no masses,  no organomegaly, stable umbilical hernia Lymph nodes: Cervical, supraclavicular, and axillary nodes normal.  No abnormal inguinal nodes palpated Neurologic: Grossly normal  Pelvic: External genitalia:  no lesions              Urethra:  normal appearing urethra with no masses, tenderness or lesions              Bartholins and Skenes: normal                 Vagina: normal appearing vagina with atrophic changes and no discharge, no lesions  Cervix: no lesions              Pap taken: No. Bimanual Exam:  Uterus:  normal size, contour, position, consistency, mobility, non-tender              Adnexa: normal adnexa and no mass, fullness, tenderness               Rectovaginal: Confirms               Anus:  normal sphincter tone, no lesions  Chaperone, Margret Chance, CMA, was present for exam.  Assessment/Plan: 1. Encntr for gyn exam (general) (routine) w/o abn findings - discussed pap smear guidelines.  She feels comfortable not having another pap smear - MMG 05/03/2020 - colonoscopy 10/13  2. Postmenopausal - no HRT  3. Age-related osteoporosis without current pathological fracture - took Prolia x 2 doses.  Has taken Fosamax and Boniva and had side effects.  Not interested in Reclast due to concerns about side effects.  We discussed whether or nor we would repeat the BMD.  She is not interested in any treatment so doesn't feel needs to have testing done  again.  4. Recurrent depressive disorder (HCC) - off medication  5. Umbilical hernia without obstruction and without gangrene - stable  6.  Anxiety  Total time with pt/visit:  22 minutes

## 2020-12-28 ENCOUNTER — Other Ambulatory Visit: Payer: Self-pay | Admitting: Family Medicine

## 2020-12-28 DIAGNOSIS — I6782 Cerebral ischemia: Secondary | ICD-10-CM

## 2020-12-28 DIAGNOSIS — E7800 Pure hypercholesterolemia, unspecified: Secondary | ICD-10-CM

## 2021-03-22 DIAGNOSIS — H43393 Other vitreous opacities, bilateral: Secondary | ICD-10-CM | POA: Diagnosis not present

## 2021-03-22 DIAGNOSIS — H52202 Unspecified astigmatism, left eye: Secondary | ICD-10-CM | POA: Diagnosis not present

## 2021-03-22 DIAGNOSIS — H524 Presbyopia: Secondary | ICD-10-CM | POA: Diagnosis not present

## 2021-03-22 DIAGNOSIS — H5203 Hypermetropia, bilateral: Secondary | ICD-10-CM | POA: Diagnosis not present

## 2021-03-22 DIAGNOSIS — H2513 Age-related nuclear cataract, bilateral: Secondary | ICD-10-CM | POA: Diagnosis not present

## 2021-04-10 DIAGNOSIS — Z1231 Encounter for screening mammogram for malignant neoplasm of breast: Secondary | ICD-10-CM | POA: Diagnosis not present

## 2021-04-17 ENCOUNTER — Encounter: Payer: Self-pay | Admitting: Family Medicine

## 2021-04-17 ENCOUNTER — Ambulatory Visit (INDEPENDENT_AMBULATORY_CARE_PROVIDER_SITE_OTHER): Payer: Medicare PPO | Admitting: Family Medicine

## 2021-04-17 ENCOUNTER — Other Ambulatory Visit: Payer: Self-pay

## 2021-04-17 VITALS — BP 118/68 | HR 69 | Temp 97.9°F | Ht 64.0 in | Wt 147.2 lb

## 2021-04-17 DIAGNOSIS — E7800 Pure hypercholesterolemia, unspecified: Secondary | ICD-10-CM

## 2021-04-17 DIAGNOSIS — Z0000 Encounter for general adult medical examination without abnormal findings: Secondary | ICD-10-CM

## 2021-04-17 DIAGNOSIS — M810 Age-related osteoporosis without current pathological fracture: Secondary | ICD-10-CM | POA: Diagnosis not present

## 2021-04-17 LAB — COMPREHENSIVE METABOLIC PANEL
ALT: 15 U/L (ref 0–35)
AST: 28 U/L (ref 0–37)
Albumin: 4.3 g/dL (ref 3.5–5.2)
Alkaline Phosphatase: 62 U/L (ref 39–117)
BUN: 12 mg/dL (ref 6–23)
CO2: 27 mEq/L (ref 19–32)
Calcium: 9.7 mg/dL (ref 8.4–10.5)
Chloride: 106 mEq/L (ref 96–112)
Creatinine, Ser: 0.81 mg/dL (ref 0.40–1.20)
GFR: 73.53 mL/min (ref >60.00)
Glucose, Bld: 85 mg/dL (ref 70–99)
Potassium: 4.4 mEq/L (ref 3.5–5.1)
Sodium: 141 mEq/L (ref 135–145)
Total Bilirubin: 0.7 mg/dL (ref 0.2–1.2)
Total Protein: 7.1 g/dL (ref 6.0–8.3)

## 2021-04-17 LAB — LIPID PANEL
Cholesterol: 143 mg/dL (ref 0–200)
HDL: 56.3 mg/dL (ref >39.00)
LDL Cholesterol: 59 mg/dL (ref 0–99)
NonHDL: 86.55
Total CHOL/HDL Ratio: 3
Triglycerides: 139 mg/dL (ref 0.0–149.0)
VLDL: 27.8 mg/dL (ref 0.0–40.0)

## 2021-04-17 LAB — VITAMIN D 25 HYDROXY (VIT D DEFICIENCY, FRACTURES): VITD: 35.49 ng/mL (ref 30.00–100.00)

## 2021-04-17 NOTE — Patient Instructions (Addendum)
Give [place] 2-3 business days to get the results of your labs back.   Keep the diet clean and stay active.  Consider wt resistance exercise and yoga.   I recommend getting the flu shot in mid October. This suggestion would change if the CDC comes out with a different recommendation.   The new Shingrix vaccine (for shingles) is a 2 shot series. It can make people feel low energy, achy and almost like they have the flu for 48 hours after injection. Please plan accordingly when deciding on when to get this shot. Call your pharmacy for an appointment to get this. The second shot of the series is less severe regarding the side effects, but it still lasts 48 hours.   Let us know if you need anything.  Mid-Back Strain Rehab It is normal to feel mild stretching, pulling, tightness, or discomfort as you do these exercises, but you should stop right away if you feel sudden pain or your pain gets worse.  Stretching and range of motion exercises This exercise warms up your muscles and joints and improves the movement and flexibility of your back and shoulders. This exercise also help to relieve pain. Exercise A: Chest and spine stretch  Lie down on your back on a firm surface. Roll a towel or a small blanket so it is about 4 inches (10 cm) in diameter. Put the towel lengthwise under the middle of your back so it is under your spine, but not under your shoulder blades. To increase the stretch, you may put your hands behind your head and let your elbows fall to your sides. Hold for 30 seconds. Repeat exercise 2 times. Complete this exercise 3 times per week. Strengthening exercises These exercises build strength and endurance in your back and your shoulder blade muscles. Endurance is the ability to use your muscles for a long time, even after they get tired. Exercise C: Straight arm rows (shoulder extension) Stand with your feet shoulder width apart. Secure an exercise band to a stable object in front of  you so the band is at or above shoulder height. Hold one end of the exercise band in each hand. Straighten your elbows and lift your hands up to shoulder height. Step back, away from the secured end of the exercise band, until the band stretches. Squeeze your shoulder blades together and pull your hands down to the sides of your thighs. Stop when your hands are straight down by your sides. Do not let your hands go behind your body. Hold for 2 seconds. Slowly return to the starting position. Repeat 2 times. Complete this exercise 3 times per week. Exercise D: Shoulder external rotation, prone Lie on your abdomen on a firm bed so your left / right forearm hangs over the edge of the bed and your upper arm is on the bed, straight out from your body. Your elbow should be bent. Your palm should be facing your feet. If instructed, hold a 2-5 lb weight in your hand. Squeeze your shoulder blade toward the middle of your back. Do not let your shoulder lift toward your ear. Keep your elbow bent in an "L" shape (90 degrees) while you slowly move your forearm up toward the ceiling. Move your forearm up to the height of the bed, toward your head. Your upper arm should not move. At the top of the movement, your palm should face the floor. Hold for 1 second. Slowly return to the starting position and relax your muscles. Repeat  2 times. Complete this exercise 3 times per week. Exercise E: Scapular retraction and external rotation, rowing  Sit in a stable chair without armrests, or stand. Secure an exercise band to a stable object in front of you so it is at shoulder height. Hold one end of the exercise band in each hand. Bring your arms out straight in front of you. Step back, away from the secured end of the exercise band, until the band stretches. Pull the band backward. As you do this, bend your elbows and squeeze your shoulder blades together, but avoid letting the rest of your body move. Do not let  your shoulders lift up toward your ears. Stop when your elbows are at your sides or slightly behind your body. Hold for 1 second1. Slowly straighten your arms to return to the starting position. Repeat 2 times. Complete this exercise 3 times per week. Posture and body mechanics  Body mechanics refers to the movements and positions of your body while you do your daily activities. Posture is part of body mechanics. Good posture and healthy body mechanics can help to relieve stress in your body's tissues and joints. Good posture means that your spine is in its natural S-curve position (your spine is neutral), your shoulders are pulled back slightly, and your head is not tipped forward. The following are general guidelines for applying improved posture and body mechanics to your everyday activities. Standing  When standing, keep your spine neutral and your feet about hip-width apart. Keep a slight bend in your knees. Your ears, shoulders, and hips should line up. When you do a task in which you lean forward while standing in one place for a long time, place one foot up on a stable object that is 2-4 inches (5-10 cm) high, such as a footstool. This helps keep your spine neutral. Sitting  When sitting, keep your spine neutral and keep your feet flat on the floor. Use a footrest, if necessary, and keep your thighs parallel to the floor. Avoid rounding your shoulders, and avoid tilting your head forward. When working at a desk or a computer, keep your desk at a height where your hands are slightly lower than your elbows. Slide your chair under your desk so you are close enough to maintain good posture. When working at a computer, place your monitor at a height where you are looking straight ahead and you do not have to tilt your head forward or downward to look at the screen. Resting  When lying down and resting, avoid positions that are most painful for you. If you have pain with activities such as  sitting, bending, stooping, or squatting (flexion-based activities), lie in a position in which your body does not bend very much. For example, avoid curling up on your side with your arms and knees near your chest (fetal position). If you have pain with activities such as standing for a long time or reaching with your arms (extension-based activities), lie with your spine in a neutral position and bend your knees slightly. Try the following positions: Lying on your side with a pillow between your knees. Lying on your back with a pillow under your knees.  Lifting  When lifting objects, keep your feet at least shoulder-width apart and tighten your abdominal muscles. Bend your knees and hips and keep your spine neutral. It is important to lift using the strength of your legs, not your back. Do not lock your knees straight out. Always ask for help to  lift heavy or awkward objects. Make sure you discuss any questions you have with your health care provider. Document Released: 08/12/2005 Document Revised: 04/18/2016 Document Reviewed: 05/24/2015 Elsevier Interactive Patient Education  [employer].

## 2021-04-17 NOTE — Progress Notes (Signed)
Chief Complaint  Patient presents with   Annual Exam     Well Woman Kayla Savage is here for a complete physical.   Her last physical was >1 year ago.  Current diet: in general, Diet is fair Current exercise: walking. Weight is stable and she denies daytime fatigue. Seatbelt? Yes  Health Maintenance Colonoscopy- Yes Shingrix- No DEXA- Yes Mammogram- Yes Tetanus- Yes Pneumonia- Yes Hep C screen- Yes  Past Medical History:  Diagnosis Date   Hearing loss    Osteoporosis    Tinnitus      Past Surgical History:  Procedure Laterality Date   FACIAL COSMETIC SURGERY     NOSE SURGERY  10/02/2016   due to injury   TONSILLECTOMY      Medications  Current Outpatient Medications on File Prior to Visit  Medication Sig Dispense Refill   aspirin-acetaminophen-caffeine (EXCEDRIN MIGRAINE) 250-250-65 MG per tablet Take 1 tablet by mouth every 6 (six) hours as needed for pain.     Multiple Vitamin (MULTIVITAMIN) tablet Take 1 tablet by mouth daily.     NON FORMULARY Shaklee Osteomatric     rosuvastatin (CRESTOR) 10 MG tablet TAKE 1 TABLET BY MOUTH EVERY DAY 90 tablet 2    Allergies Allergies  Allergen Reactions   Boniva [Ibandronic Acid]    Evista [Raloxifene]    Lexapro [Escitalopram Oxalate]     Fatigue, dizzy, insomnia   Other     IV Dye   Prolia [Denosumab]     Review of Systems: Constitutional:  no fevers Eye:  no recent significant change in vision Ears:  No changes in hearing Nose/Mouth/Throat:  no complaints of nasal congestion, no sore throat Cardiovascular: no chest pain Respiratory:  No shortness of breath Gastrointestinal:  No change in bowel habits GU:  Female: negative for dysuria Integumentary:  no abnormal skin lesions reported Neurologic:  no headaches Endocrine:  denies unexplained weight changes  Exam BP 118/68   Pulse 69   Temp 97.9 F (36.6 C) (Oral)   Ht 5\' 4"  (1.626 m)   Wt 147 lb 4 oz (66.8 kg)   LMP 11/23/2004   SpO2 96%   BMI  25.28 kg/m  General:  well developed, well nourished, in no apparent distress Skin:  no significant moles, warts, or growths Head:  no masses, lesions, or tenderness Eyes:  pupils equal and round, sclera anicteric without injection Ears:  canals without lesions, TMs shiny without retraction, no obvious effusion, no erythema Nose:  nares patent, septum midline, mucosa normal, and no drainage or sinus tenderness Throat/Pharynx:  lips and gingiva without lesion; tongue and uvula midline; non-inflamed pharynx; no exudates or postnasal drainage Neck: neck supple without adenopathy, thyromegaly, or masses Lungs:  clear to auscultation, breath sounds equal bilaterally, no respiratory distress Cardio:  regular rate and rhythm, no bruits or LE edema Abdomen:  abdomen soft, nontender; bowel sounds normal; no masses or organomegaly Genital: Deferred Neuro:  gait normal; deep tendon reflexes normal and symmetric Psych: well oriented with normal range of affect and appropriate judgment/insight  Assessment and Plan  Well adult exam  Age-related osteoporosis without current pathological fracture - Plan: DG Bone Density, VITAMIN D 25 Hydroxy (Vit-D Deficiency, Fractures)  Pure hypercholesterolemia - Plan: Comprehensive metabolic panel, Lipid panel   Well 70 y.o. female. Counseled on diet and exercise. Will place DEXA order now so she can schedule for Dec as she likely won't be seen prior to that.  Flu shot rec'd for Oct. Shingrix rec'd, to contact pharmacy  regarding this.  Other orders as above. Follow up in 6 mo or prn. The patient voiced understanding and agreement to the plan.  Jilda Roche [place], DO 04/17/21 9:25 AM

## 2021-08-29 DIAGNOSIS — L738 Other specified follicular disorders: Secondary | ICD-10-CM | POA: Diagnosis not present

## 2021-08-29 DIAGNOSIS — L821 Other seborrheic keratosis: Secondary | ICD-10-CM | POA: Diagnosis not present

## 2021-08-29 DIAGNOSIS — L578 Other skin changes due to chronic exposure to nonionizing radiation: Secondary | ICD-10-CM | POA: Diagnosis not present

## 2021-08-29 DIAGNOSIS — Q825 Congenital non-neoplastic nevus: Secondary | ICD-10-CM | POA: Diagnosis not present

## 2021-08-29 DIAGNOSIS — D225 Melanocytic nevi of trunk: Secondary | ICD-10-CM | POA: Diagnosis not present

## 2021-08-29 DIAGNOSIS — L570 Actinic keratosis: Secondary | ICD-10-CM | POA: Diagnosis not present

## 2021-08-29 DIAGNOSIS — D2230 Melanocytic nevi of unspecified part of face: Secondary | ICD-10-CM | POA: Diagnosis not present

## 2021-09-17 ENCOUNTER — Other Ambulatory Visit: Payer: Self-pay | Admitting: Family Medicine

## 2021-09-17 DIAGNOSIS — E7800 Pure hypercholesterolemia, unspecified: Secondary | ICD-10-CM

## 2021-09-17 DIAGNOSIS — I6782 Cerebral ischemia: Secondary | ICD-10-CM

## 2021-10-22 ENCOUNTER — Ambulatory Visit: Payer: Medicare PPO | Admitting: Family Medicine

## 2021-10-22 ENCOUNTER — Encounter: Payer: Self-pay | Admitting: Family Medicine

## 2021-10-22 VITALS — BP 108/70 | HR 73 | Temp 97.9°F | Ht 63.0 in | Wt 144.5 lb

## 2021-10-22 DIAGNOSIS — E2839 Other primary ovarian failure: Secondary | ICD-10-CM | POA: Diagnosis not present

## 2021-10-22 DIAGNOSIS — E7800 Pure hypercholesterolemia, unspecified: Secondary | ICD-10-CM | POA: Diagnosis not present

## 2021-10-22 DIAGNOSIS — M549 Dorsalgia, unspecified: Secondary | ICD-10-CM

## 2021-10-22 DIAGNOSIS — G8929 Other chronic pain: Secondary | ICD-10-CM | POA: Diagnosis not present

## 2021-10-22 LAB — LIPID PANEL
Cholesterol: 152 mg/dL (ref 0–200)
HDL: 61.5 mg/dL (ref >39.00)
LDL Cholesterol: 69 mg/dL (ref 0–99)
NonHDL: 90.12
Total CHOL/HDL Ratio: 2
Triglycerides: 107 mg/dL (ref 0.0–149.0)
VLDL: 21.4 mg/dL (ref 0.0–40.0)

## 2021-10-22 LAB — COMPREHENSIVE METABOLIC PANEL
ALT: 17 U/L (ref 0–35)
AST: 28 U/L (ref 0–37)
Albumin: 4.5 g/dL (ref 3.5–5.2)
Alkaline Phosphatase: 64 U/L (ref 39–117)
BUN: 11 mg/dL (ref 6–23)
CO2: 29 mEq/L (ref 19–32)
Calcium: 9.8 mg/dL (ref 8.4–10.5)
Chloride: 106 mEq/L (ref 96–112)
Creatinine, Ser: 0.75 mg/dL (ref 0.40–1.20)
GFR: 80.35 mL/min (ref >60.00)
Glucose, Bld: 88 mg/dL (ref 70–99)
Potassium: 4.8 mEq/L (ref 3.5–5.1)
Sodium: 141 mEq/L (ref 135–145)
Total Bilirubin: 0.6 mg/dL (ref 0.2–1.2)
Total Protein: 7.4 g/dL (ref 6.0–8.3)

## 2021-10-22 NOTE — Patient Instructions (Addendum)
Give us 2-3 business days to get the results of your labs back.  ? ?Keep the diet clean and stay active. ? ?The Shingrix vaccine (for shingles) is a 2 shot series. It can make people feel low energy, achy and almost like they have the flu for 48 hours after injection. Please plan accordingly when deciding on when to get this shot. Call your pharmacy to get this. The second shot of the series is less severe regarding the side effects, but it still lasts 48 hours.  ? ?Let us know if you need anything. ?

## 2021-10-22 NOTE — Progress Notes (Signed)
Chief Complaint  Patient presents with   Follow-up    Subjective: Hyperlipidemia Patient presents for Hyperlipidemia follow up. Currently taking Crestor 20 mg/d and compliance with treatment thus far has been good. She denies myalgias. She is not always adhering to a healthy diet. Exercise: walking No Cp or SOB.  The patient is not known to have coexisting coronary artery disease.  Chronic back pain Bac pain for a few decades. No recent inj or change in activity. Worse when she stands for a period of time. No neuro s/s's. No bruising, redness, swelling. Had PT around 15 years ago. No bowel/bladder incontinence.   Past Medical History:  Diagnosis Date   Hearing loss    Osteoporosis    Tinnitus     Objective: BP 108/70    Pulse 73    Temp 97.9 F (36.6 C) (Oral)    Ht 5\' 3"  (1.6 m)    Wt 144 lb 8 oz (65.5 kg)    LMP 11/23/2004    SpO2 99%    BMI 25.60 kg/m  General: Awake, appears stated age HEENT: MMM Heart: RRR, no LE edema, no bruits Lungs: CTAB, no rales, wheezes or rhonchi. No accessory muscle use MSK: No ttp over midline or parasp msc from cerv to lumbar Neuro: DTR's equal and symmetric throughout, no clonus, no cerebellar signs, 5/5 strength throughout Psych: Age appropriate judgment and insight, normal affect and mood  Assessment and Plan: Pure hypercholesterolemia - Plan: Comprehensive metabolic panel, Lipid panel  Chronic bilateral back pain, unspecified back location - Plan: Ambulatory referral to Physical Therapy  Estrogen deficiency - Plan: DG Bone Density  Chronic, stable. Cont Crestor 20 mg/d. Ck labs.  Chronic, unstable. Refer PT. Likely need to strengthen axial muscles.  Ck DEXA.  Shingrix rec'd.  F/u in 6 mo or prn. The patient voiced understanding and agreement to the plan.  11/25/2004 [place], DO 10/22/21  9:32 AM

## 2021-11-19 DIAGNOSIS — M546 Pain in thoracic spine: Secondary | ICD-10-CM | POA: Diagnosis not present

## 2021-11-19 DIAGNOSIS — G8929 Other chronic pain: Secondary | ICD-10-CM | POA: Diagnosis not present

## 2021-11-19 DIAGNOSIS — M549 Dorsalgia, unspecified: Secondary | ICD-10-CM | POA: Diagnosis not present

## 2021-11-28 ENCOUNTER — Telehealth: Payer: Self-pay | Admitting: Family Medicine

## 2021-11-28 NOTE — Telephone Encounter (Signed)
Called the patient to let her know that Bone density was ordered at [facility]  ?Gave her the number to call [phone] to get this scheduled, ?

## 2021-11-28 NOTE — Telephone Encounter (Signed)
Faxed over order for Bone Density  ?[phone] to [facility] ?Patient informed ?Received fax confirmation ?Phone number to [facility] [phone] ?They had not received the order, ?

## 2021-11-28 NOTE — Telephone Encounter (Signed)
Pt states she called [facility] and they have not received order.  ?

## 2021-11-28 NOTE — Telephone Encounter (Signed)
Pt states Derrill Center, physical therapist at [facility] states she is hesitant to do anything without a bone density scan. Pt stated bone density was supposed to be sent to [facility] on [address], but never heard anything back. Please advise.  ?

## 2021-11-28 NOTE — Telephone Encounter (Signed)
Order faxed.

## 2021-11-29 DIAGNOSIS — M810 Age-related osteoporosis without current pathological fracture: Secondary | ICD-10-CM | POA: Diagnosis not present

## 2021-11-29 DIAGNOSIS — Z780 Asymptomatic menopausal state: Secondary | ICD-10-CM | POA: Diagnosis not present

## 2021-12-03 ENCOUNTER — Encounter: Payer: Self-pay | Admitting: Family Medicine

## 2021-12-07 DIAGNOSIS — G8929 Other chronic pain: Secondary | ICD-10-CM | POA: Diagnosis not present

## 2021-12-07 DIAGNOSIS — M549 Dorsalgia, unspecified: Secondary | ICD-10-CM | POA: Diagnosis not present

## 2021-12-07 DIAGNOSIS — M546 Pain in thoracic spine: Secondary | ICD-10-CM | POA: Diagnosis not present

## 2021-12-10 DIAGNOSIS — M549 Dorsalgia, unspecified: Secondary | ICD-10-CM | POA: Diagnosis not present

## 2021-12-10 DIAGNOSIS — G8929 Other chronic pain: Secondary | ICD-10-CM | POA: Diagnosis not present

## 2021-12-10 DIAGNOSIS — M546 Pain in thoracic spine: Secondary | ICD-10-CM | POA: Diagnosis not present

## 2021-12-12 DIAGNOSIS — M546 Pain in thoracic spine: Secondary | ICD-10-CM | POA: Diagnosis not present

## 2021-12-12 DIAGNOSIS — M549 Dorsalgia, unspecified: Secondary | ICD-10-CM | POA: Diagnosis not present

## 2021-12-12 DIAGNOSIS — G8929 Other chronic pain: Secondary | ICD-10-CM | POA: Diagnosis not present

## 2021-12-19 DIAGNOSIS — M546 Pain in thoracic spine: Secondary | ICD-10-CM | POA: Diagnosis not present

## 2021-12-19 DIAGNOSIS — G8929 Other chronic pain: Secondary | ICD-10-CM | POA: Diagnosis not present

## 2021-12-19 DIAGNOSIS — M549 Dorsalgia, unspecified: Secondary | ICD-10-CM | POA: Diagnosis not present

## 2021-12-21 DIAGNOSIS — M546 Pain in thoracic spine: Secondary | ICD-10-CM | POA: Diagnosis not present

## 2021-12-21 DIAGNOSIS — M549 Dorsalgia, unspecified: Secondary | ICD-10-CM | POA: Diagnosis not present

## 2021-12-21 DIAGNOSIS — G8929 Other chronic pain: Secondary | ICD-10-CM | POA: Diagnosis not present

## 2021-12-24 ENCOUNTER — Ambulatory Visit (INDEPENDENT_AMBULATORY_CARE_PROVIDER_SITE_OTHER): Payer: Medicare PPO

## 2021-12-24 ENCOUNTER — Telehealth: Payer: Self-pay

## 2021-12-24 VITALS — Ht 63.0 in | Wt 144.0 lb

## 2021-12-24 DIAGNOSIS — Z0000 Encounter for general adult medical examination without abnormal findings: Secondary | ICD-10-CM

## 2021-12-24 DIAGNOSIS — M549 Dorsalgia, unspecified: Secondary | ICD-10-CM | POA: Diagnosis not present

## 2021-12-24 DIAGNOSIS — G8929 Other chronic pain: Secondary | ICD-10-CM | POA: Diagnosis not present

## 2021-12-24 NOTE — Patient Instructions (Signed)
Kayla Savage , ?Thank you for taking time to complete your Medicare Wellness Visit. I appreciate your ongoing commitment to your health goals. Please review the following plan we discussed and let me know if I can assist you in the future.  ? ?Screening recommendations/referrals: ?Colonoscopy: Completed 05/29/2012-Due 05/29/2022 ?Mammogram: Per our conversation completed 04/2021. Please have copy of report sent to Dr. Nani Ravens. ?Bone Density: Completed 11/29/2021-Due 11/30/2023 ?Recommended yearly ophthalmology/optometry visit for glaucoma screening and checkup ?Recommended yearly dental visit for hygiene and checkup ? ?Vaccinations: ?Influenza vaccine: Due-04/2022 ?Pneumococcal vaccine: Up to date ?Tdap vaccine: Due-May obtain vaccine at  your local pharmacy. ?Shingles vaccine: Due-May obtain vaccine at your local pharmacy. ?Covid-19:Up to date ? ?Advanced directives: Discussed. Declined to have copy in chart. ? ? ?Conditions/risks identified: See problem list ? ?Next appointment: Follow up in one year for your annual wellness visit  ? ? ?Preventive Care 36 Years and Older, Female ?Preventive care refers to lifestyle choices and visits with your health care provider that can promote health and wellness. ?What does preventive care include? ?A yearly physical exam. This is also called an annual well check. ?Dental exams once or twice a year. ?Routine eye exams. Ask your health care provider how often you should have your eyes checked. ?Personal lifestyle choices, including: ?Daily care of your teeth and gums. ?Regular physical activity. ?Eating a healthy diet. ?Avoiding tobacco and drug use. ?Limiting alcohol use. ?Practicing safe sex. ?Taking low-dose aspirin every day. ?Taking vitamin and mineral supplements as recommended by your health care provider. ?What happens during an annual well check? ?The services and screenings done by your health care provider during your annual well check will depend on your age, overall  health, lifestyle risk factors, and family history of disease. ?Counseling  ?Your health care provider may ask you questions about your: ?Alcohol use. ?Tobacco use. ?Drug use. ?Emotional well-being. ?Home and relationship well-being. ?Sexual activity. ?Eating habits. ?History of falls. ?Memory and ability to understand (cognition). ?Work and work Statistician. ?Reproductive health. ?Screening  ?You may have the following tests or measurements: ?Height, weight, and BMI. ?Blood pressure. ?Lipid and cholesterol levels. These may be checked every 5 years, or more frequently if you are over 71 years old. ?Skin check. ?Lung cancer screening. You may have this screening every year starting at age 71 if you have a 30-pack-year history of smoking and currently smoke or have quit within the past 15 years. ?Fecal occult blood test (FOBT) of the stool. You may have this test every year starting at age 71. ?Flexible sigmoidoscopy or colonoscopy. You may have a sigmoidoscopy every 5 years or a colonoscopy every 10 years starting at age 71. ?Hepatitis C blood test. ?Hepatitis B blood test. ?Sexually transmitted disease (STD) testing. ?Diabetes screening. This is done by checking your blood sugar (glucose) after you have not eaten for a while (fasting). You may have this done every 1-3 years. ?Bone density scan. This is done to screen for osteoporosis. You may have this done starting at age 71. ?Mammogram. This may be done every 1-2 years. Talk to your health care provider about how often you should have regular mammograms. ?Talk with your health care provider about your test results, treatment options, and if necessary, the need for more tests. ?Vaccines  ?Your health care provider may recommend certain vaccines, such as: ?Influenza vaccine. This is recommended every year. ?Tetanus, diphtheria, and acellular pertussis (Tdap, Td) vaccine. You may need a Td booster every 10 years. ?Zoster vaccine. You may  need this after age  71. ?Pneumococcal 13-valent conjugate (PCV13) vaccine. One dose is recommended after age 33. ?Pneumococcal polysaccharide (PPSV23) vaccine. One dose is recommended after age 71. ?Talk to your health care provider about which screenings and vaccines you need and how often you need them. ?This information is not intended to replace advice given to you by your health care provider. Make sure you discuss any questions you have with your health care provider. ?Document Released: 09/08/2015 Document Revised: 05/01/2016 Document Reviewed: 06/13/2015 ?Elsevier Interactive Patient Education ? 2017 [place]. ? ?Fall Prevention in the Home ?Falls can cause injuries. They can happen to people of all ages. There are many things you can do to make your home safe and to help prevent falls. ?What can I do on the outside of my home? ?Regularly fix the edges of walkways and driveways and fix any cracks. ?Remove anything that might make you trip as you walk through a door, such as a raised step or threshold. ?Trim any bushes or trees on the path to your home. ?Use bright outdoor lighting. ?Clear any walking paths of anything that might make someone trip, such as rocks or tools. ?Regularly check to see if handrails are loose or broken. Make sure that both sides of any steps have handrails. ?Any raised decks and porches should have guardrails on the edges. ?Have any leaves, snow, or ice cleared regularly. ?Use sand or salt on walking paths during winter. ?Clean up any spills in your garage right away. This includes oil or grease spills. ?What can I do in the bathroom? ?Use night lights. ?Install grab bars by the toilet and in the tub and shower. Do not use towel bars as grab bars. ?Use non-skid mats or decals in the tub or shower. ?If you need to sit down in the shower, use a plastic, non-slip stool. ?Keep the floor dry. Clean up any water that spills on the floor as soon as it happens. ?Remove soap buildup in the tub or shower  regularly. ?Attach bath mats securely with double-sided non-slip rug tape. ?Do not have throw rugs and other things on the floor that can make you trip. ?What can I do in the bedroom? ?Use night lights. ?Make sure that you have a light by your bed that is easy to reach. ?Do not use any sheets or blankets that are too big for your bed. They should not hang down onto the floor. ?Have a firm chair that has side arms. You can use this for support while you get dressed. ?Do not have throw rugs and other things on the floor that can make you trip. ?What can I do in the kitchen? ?Clean up any spills right away. ?Avoid walking on wet floors. ?Keep items that you use a lot in easy-to-reach places. ?If you need to reach something above you, use a strong step stool that has a grab bar. ?Keep electrical cords out of the way. ?Do not use floor polish or wax that makes floors slippery. If you must use wax, use non-skid floor wax. ?Do not have throw rugs and other things on the floor that can make you trip. ?What can I do with my stairs? ?Do not leave any items on the stairs. ?Make sure that there are handrails on both sides of the stairs and use them. Fix handrails that are broken or loose. Make sure that handrails are as long as the stairways. ?Check any carpeting to make sure that it is firmly attached  to the stairs. Fix any carpet that is loose or worn. ?Avoid having throw rugs at the top or bottom of the stairs. If you do have throw rugs, attach them to the floor with carpet tape. ?Make sure that you have a light switch at the top of the stairs and the bottom of the stairs. If you do not have them, ask someone to add them for you. ?What else can I do to help prevent falls? ?Wear shoes that: ?Do not have high heels. ?Have rubber bottoms. ?Are comfortable and fit you well. ?Are closed at the toe. Do not wear sandals. ?If you use a stepladder: ?Make sure that it is fully opened. Do not climb a closed stepladder. ?Make sure that  both sides of the stepladder are locked into place. ?Ask someone to hold it for you, if possible. ?Clearly mark and make sure that you can see: ?Any grab bars or handrails. ?First and last steps. ?Where the edge of eac

## 2021-12-24 NOTE — Telephone Encounter (Signed)
Spoke to the patient and she does not want to go on recommended medication, she has tried some in the past. ?She has had  tried 4- 5 different medications and does not do well with them. ?She just wanted to have explained to her the areas that were maybe worse then others.  ?She is scheduled the end of August and can discuss then. ?

## 2021-12-24 NOTE — Telephone Encounter (Signed)
Dr. Nani Ravens,  ?Patient wants to know if you saw her bone density report? She knows it showed Osteoprosis but she would like to know your thoughts on it. She is requesting that you send her a message on mychart. ?

## 2021-12-24 NOTE — Progress Notes (Signed)
? ?Subjective:  ? Kayla Savage is a 71 y.o. female who presents for an Initial Medicare Annual Wellness Visit. ? ?I connected with Kayla Savage today by telephone and verified that I am speaking with the correct person using two identifiers. ?Location patient: home ?Location provider: work ?Persons participating in the virtual visit: patient, nurse.  ?  ?I discussed the limitations, risks, security and privacy concerns of performing an evaluation and management service by telephone and the availability of in person appointments. I also discussed with the patient that there may be a patient responsible charge related to this service. The patient expressed understanding and verbally consented to this telephonic visit.  ?  ?Interactive audio and video telecommunications were attempted between this provider and patient, however failed, due to patient having technical difficulties OR patient did not have access to video capability.  We continued and completed visit with audio only. ? ?Some vital signs may be absent or patient reported.  ? ?Time Spent with patient on telephone encounter: 30 minutes ? ? ?Review of Systems    ? ?Cardiac Risk Factors include: advanced age (>34men, >38 women);dyslipidemia ? ?   ?Objective:  ?  ?Today's Vitals  ? 12/24/21 1447  ?Weight: 144 lb (65.3 kg)  ?Height: 5\' 3"  (1.6 m)  ?PainSc: 5   ? ?Body mass index is 25.51 kg/m?. ? ? ?  12/24/2021  ?  2:51 PM 12/24/2021  ?  2:50 PM  ?Advanced Directives  ?Does Patient Have a Medical Advance Directive? Yes Yes  ?Type of Paramedic of [place];Living will [place];Living will  ?Copy of [place] in Chart?  No - copy requested  ? ? ?Current Medications (verified) ?Outpatient Encounter Medications as of 12/24/2021  ?Medication Sig  ? aspirin-acetaminophen-caffeine (EXCEDRIN MIGRAINE) 250-250-65 MG per tablet Take 1 tablet by mouth every 6 (six) hours as needed for pain.  ? Multiple Vitamin  (MULTIVITAMIN) tablet Take 1 tablet by mouth daily.  ? rosuvastatin (CRESTOR) 10 MG tablet TAKE 1 TABLET BY MOUTH EVERY DAY  ? NON FORMULARY [place]  ? ?No facility-administered encounter medications on file as of 12/24/2021.  ? ? ?Allergies (verified) ?Boniva [ibandronic acid], Evista [raloxifene], Lexapro [escitalopram oxalate], Other, and Prolia [denosumab]  ? ?History: ?Past Medical History:  ?Diagnosis Date  ? Hearing loss   ? Osteoporosis   ? Tinnitus   ? ?Past Surgical History:  ?Procedure Laterality Date  ? FACIAL COSMETIC SURGERY    ? NOSE SURGERY  10/02/2016  ? due to injury  ? TONSILLECTOMY    ? ?Family History  ?Problem Relation Age of Onset  ? Breast cancer Maternal Grandmother   ? Heart disease Other   ? Parkinson's disease Mother   ? Stroke Mother   ? Heart disease Father   ? ?Social History  ? ?Socioeconomic History  ? Marital status: Married  ?  Spouse name: Not on file  ? Number of children: 2  ? Years of education: Not on file  ? Highest education level: Not on file  ?Occupational History  ? Not on file  ?Tobacco Use  ? Smoking status: Never  ? Smokeless tobacco: Never  ?Vaping Use  ? Vaping Use: Never used  ?Substance and Sexual Activity  ? Alcohol use: No  ?  Alcohol/week: 0.0 standard drinks  ? Drug use: No  ? Sexual activity: Not Currently  ?  Birth control/protection: Post-menopausal  ?Other Topics Concern  ? Not on file  ?Social History Narrative  ?  Not on file  ? ?Social Determinants of Health  ? ?Financial Resource Strain: Low Risk   ? Difficulty of Paying Living Expenses: Not hard at all  ?Food Insecurity: No Food Insecurity  ? Worried About Charity fundraiser in the Last Year: Never true  ? Ran Out of Food in the Last Year: Never true  ?Transportation Needs: No Transportation Needs  ? Lack of Transportation (Medical): No  ? Lack of Transportation (Non-Medical): No  ?Physical Activity: Inactive  ? Days of Exercise per Week: 0 days  ? Minutes of Exercise per Session: 0 min   ?Stress: Stress Concern Present  ? Feeling of Stress : To some extent  ?Social Connections: Moderately Isolated  ? Frequency of Communication with Friends and Family: Never  ? Frequency of Social Gatherings with Friends and Family: Once a week  ? Attends Religious Services: More than 4 times per year  ? Active Member of Clubs or Organizations: No  ? Attends Archivist Meetings: Never  ? Marital Status: Married  ? ? ?Tobacco Counseling ?Counseling given: Not Answered ? ? ?Clinical Intake: ? ?Pre-visit preparation completed: Yes ? ?Pain : 0-10 ?Pain Score: 5  ?Pain Type: Chronic pain ?Pain Location: Back ?Pain Onset: More than a month ago ?Pain Frequency: Constant ? ?  ? ?BMI - recorded: 25.51 ?Nutritional Status: BMI 25 -29 Overweight ?Nutritional Risks: None ?Diabetes: No ? ?How often do you need to have someone help you when you read instructions, pamphlets, or other written materials from your doctor or pharmacy?: 1 - Never ? ?Diabetic?No ? ?Interpreter Needed?: No ? ?Information entered by :: Caroleen Hamman LPN ? ? ?Activities of Daily Living ? ?  12/24/2021  ?  2:53 PM  ?In your present state of health, do you have any difficulty performing the following activities:  ?Hearing? 1  ?Vision? 0  ?Difficulty concentrating or making decisions? 0  ?Walking or climbing stairs? 0  ?Dressing or bathing? 0  ?Doing errands, shopping? 0  ?Preparing Food and eating ? N  ?Using the Toilet? N  ?In the past six months, have you accidently leaked urine? N  ?Do you have problems with loss of bowel control? N  ?Managing your Medications? N  ?Managing your Finances? N  ?Housekeeping or managing your Housekeeping? N  ? ? ?Patient Care Team: ?Shelda Pal, DO as PCP - General (Family Medicine) ? ?Indicate any recent Medical Services you may have received from other than [facility] providers in the past year (date may be approximate). ? ?   ?Assessment:  ? This is a routine wellness examination for  Kayla Savage. ? ?Hearing/Vision screen ?Hearing Screening - Comments:: C/o hearing loss but does not want a hearing aid ?Vision Screening - Comments:: Last eye exam-Summer 2022-Dr. Tepidino ? ?Dietary issues and exercise activities discussed: ?Current Exercise Habits: The patient does not participate in regular exercise at present, Exercise limited by: orthopedic condition(s) ? ? Goals Addressed   ? ?  ?  ?  ?  ? This Visit's Progress  ?  Patient Stated     ?  Maintain current health ?  ? ?  ? ?Depression Screen ? ?  12/24/2021  ?  2:53 PM 10/22/2021  ?  9:01 AM 04/17/2021  ?  8:53 AM  ?PHQ 2/9 Scores  ?PHQ - 2 Score 0 2 0  ?PHQ- 9 Score  7   ?  ?Fall Risk ? ?  12/24/2021  ?  2:52 PM 04/17/2021  ?  8:53  AM  ?Fall Risk   ?Falls in the past year? 0 0  ?Number falls in past yr: 0 0  ?Injury with Fall? 0 0  ?Risk for fall due to :  No Fall Risks  ?Follow up Falls prevention discussed Falls evaluation completed  ? ? ?FALL RISK PREVENTION PERTAINING TO THE HOME: ? ?Any stairs in or around the home? No  ?Home free of loose throw rugs in walkways, pet beds, electrical cords, etc? Yes  ?Adequate lighting in your home to reduce risk of falls? Yes  ? ?ASSISTIVE DEVICES UTILIZED TO PREVENT FALLS: ? ?Life alert? No  ?Use of a cane, walker or w/c? No  ?Grab bars in the bathroom? Yes  ?Shower chair or bench in shower? No  ?Elevated toilet seat or a handicapped toilet? No  ? ?TIMED UP AND GO: ? ?Was the test performed? No . Phone visit ? ? ?Cognitive Function:Normal cognitive status assessed by  this Nurse Health Advisor. No abnormalities found.  ? ?  ?  ?  ? ?Immunizations ?Immunization History  ?Administered Date(s) Administered  ? Influenza, High Dose Seasonal PF 07/26/2017, 07/26/2017  ? Influenza-Unspecified 07/24/2016  ? PFIZER(Purple Top)SARS-COV-2 Vaccination 10/07/2019, 10/28/2019, 05/25/2020  ? Pension scheme manager 66yrs & up 06/18/2021  ? Pneumococcal Conjugate-13 07/26/2017  ? Pneumococcal Polysaccharide-23  01/15/2016  ? Tdap 11/09/2009  ? Zoster, Live 07/03/2012  ? ? ?TDAP status: Due, Education has been provided regarding the importance of this vaccine. Advised may receive this vaccine at local pharmacy or Health Dept. Aware to provide a copy of t

## 2021-12-26 DIAGNOSIS — G8929 Other chronic pain: Secondary | ICD-10-CM | POA: Diagnosis not present

## 2021-12-26 DIAGNOSIS — M549 Dorsalgia, unspecified: Secondary | ICD-10-CM | POA: Diagnosis not present

## 2021-12-31 DIAGNOSIS — M549 Dorsalgia, unspecified: Secondary | ICD-10-CM | POA: Diagnosis not present

## 2021-12-31 DIAGNOSIS — G8929 Other chronic pain: Secondary | ICD-10-CM | POA: Diagnosis not present

## 2022-01-03 DIAGNOSIS — G8929 Other chronic pain: Secondary | ICD-10-CM | POA: Diagnosis not present

## 2022-01-03 DIAGNOSIS — M549 Dorsalgia, unspecified: Secondary | ICD-10-CM | POA: Diagnosis not present

## 2022-03-24 IMAGING — MR MR HEAD W/O CM
8 series · 48 of 48 positions shown · non-contrast
Comparison: None.

CLINICAL DATA: Headaches, right arm weakness

EXAM:
MRI HEAD WITHOUT CONTRAST
TECHNIQUE: Multiplanar, multiecho pulse sequences of the brain and surrounding
structures were obtained without intravenous contrast.

[Series 2: T1 · sagittal · 5.0mm · 0.90mm/px · 4 of 27 slices shown]
[im 1/27]
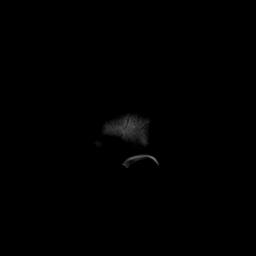
[im 9/27]
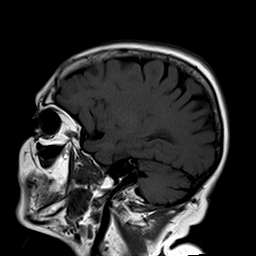
[im 18/27]
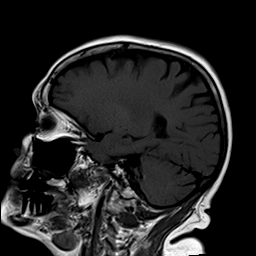
[im 27/27]
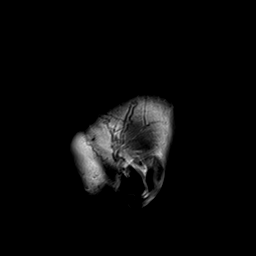

[Series 3: DWI · axial · 3.0mm · 1.88mm/px · z∈[-33,+112]mm · 12 of 95 slices shown (1 of 2)]
[im 1/95]
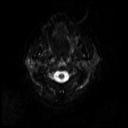
[im 9/95]
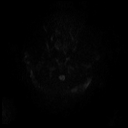
[im 18/95]
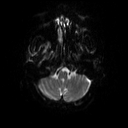
[im 26/95]
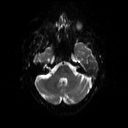
[im 35/95]
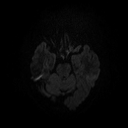
[im 43/95]
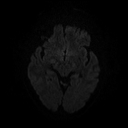
[im 52/95]
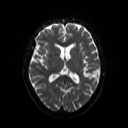
[im 60/95]
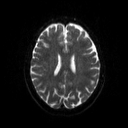
[im 69/95]
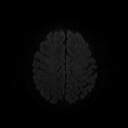
[im 77/95]
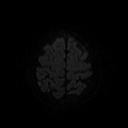
[im 86/95]
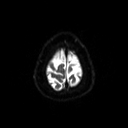
[im 95/95]
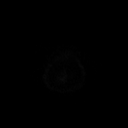

[Series 4: DWI · axial · 3.0mm · 1.88mm/px · z∈[-33,+112]mm · 6 of 48 slices shown (2 of 2)]
[im 1/48]
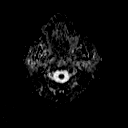
[im 10/48]
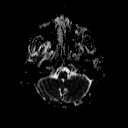
[im 19/48]
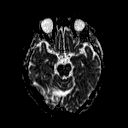
[im 29/48]
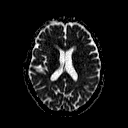
[im 38/48]
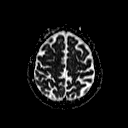
[im 48/48]
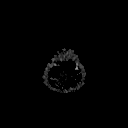

[Series 5: T2 · axial · 5.0mm · 0.66mm/px · z∈[-35,+105]mm · 3 of 26 slices shown (1 of 3)]
[im 1/26]
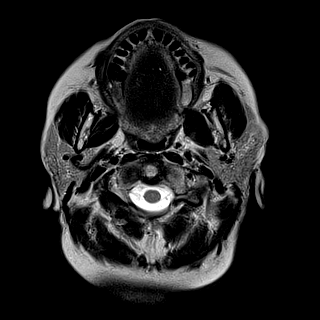
[im 13/26]
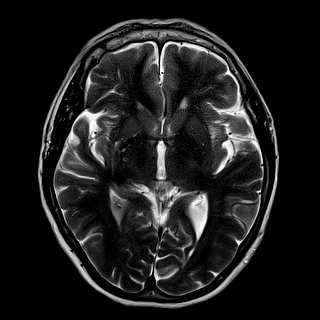
[im 26/26]
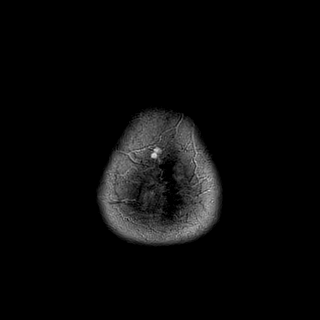

[Series 6: T2 · axial · 5.0mm · 0.41mm/px · z∈[-35,+105]mm · 3 of 26 slices shown (2 of 3)]
[im 1/26]
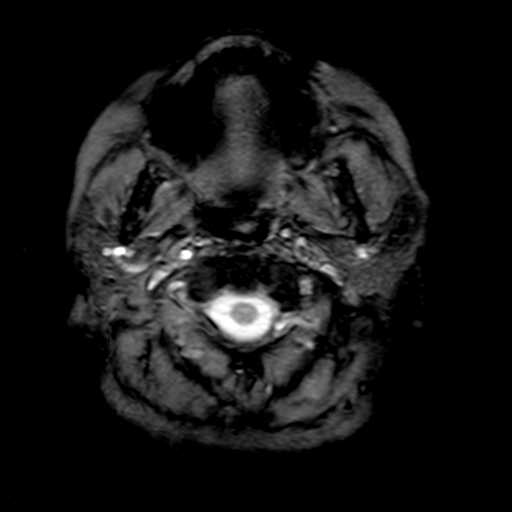
[im 13/26]
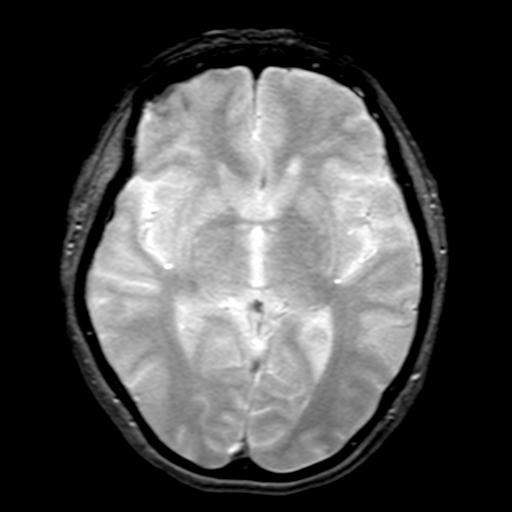
[im 26/26]
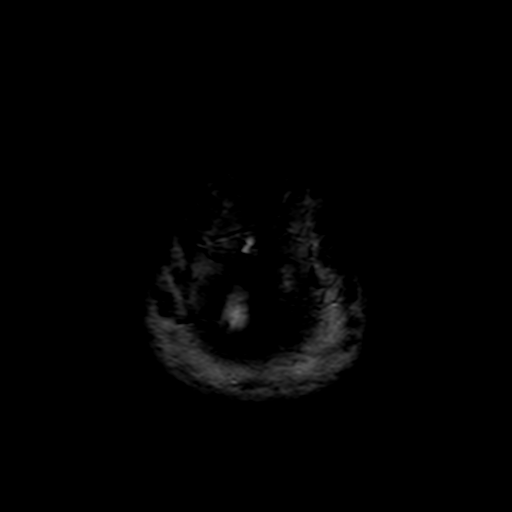

[Series 7: FLAIR · axial · 3.0mm · 0.41mm/px · z∈[-38,+108]mm · 5 of 40 slices shown]
[im 1/40]
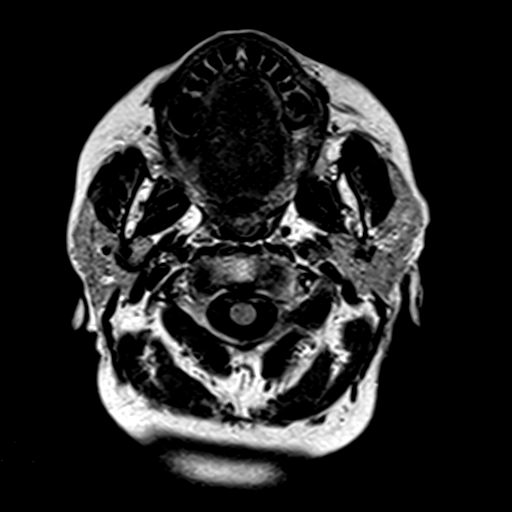
[im 10/40]
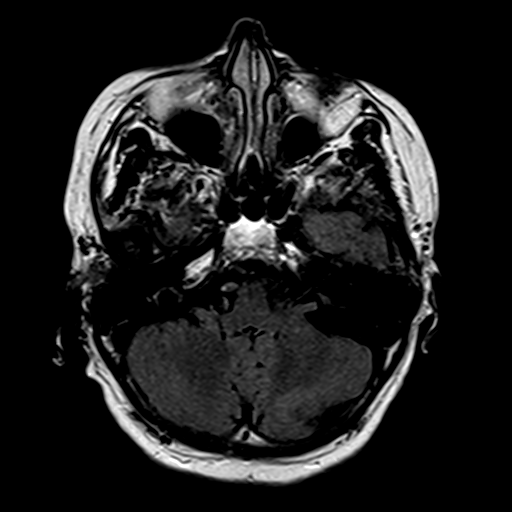
[im 20/40]
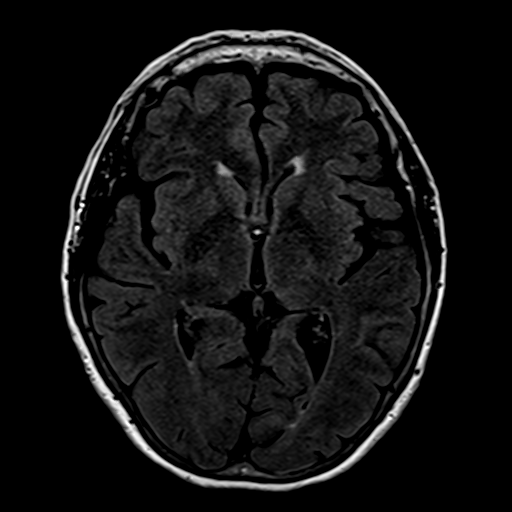
[im 30/40]
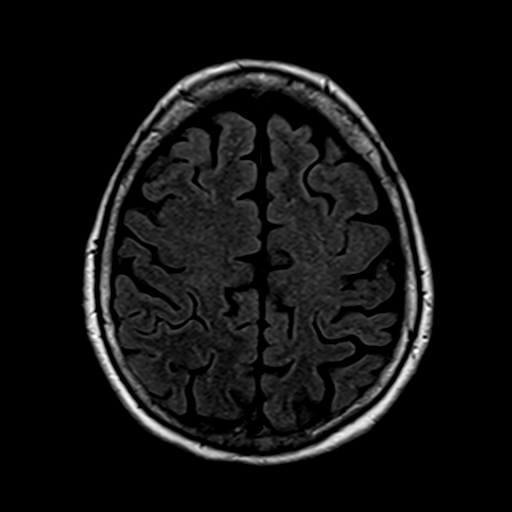
[im 40/40]
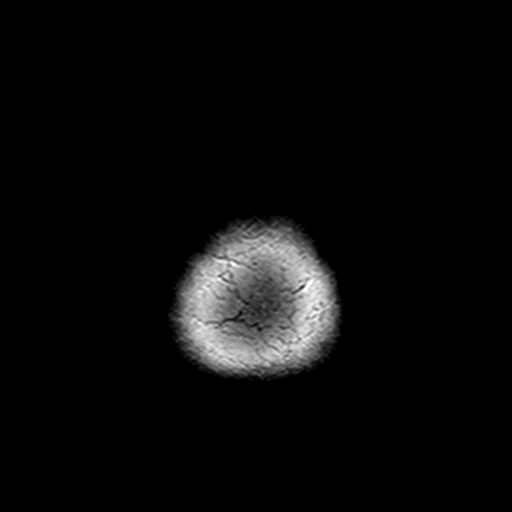

[Series 8: t1_3d_tra · axial · 2.0mm · 0.86mm/px · z∈[-48,+130]mm · 12 of 96 slices shown]
[im 1/96]
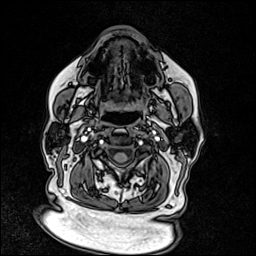
[im 9/96]
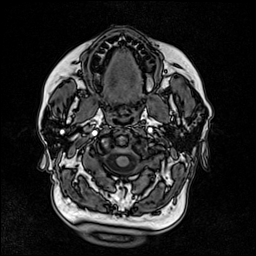
[im 18/96]
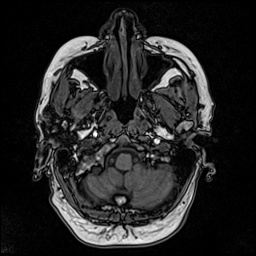
[im 26/96]
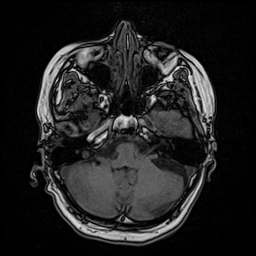
[im 35/96]
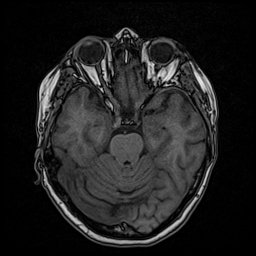
[im 44/96]
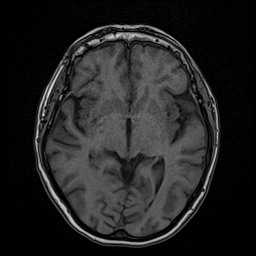
[im 52/96]
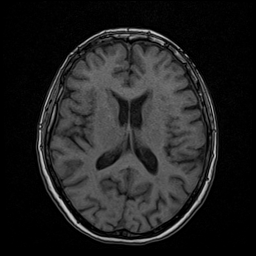
[im 61/96]
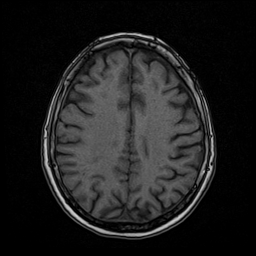
[im 70/96]
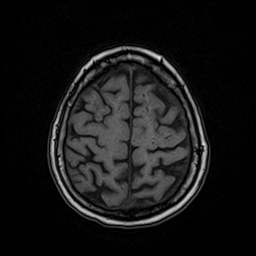
[im 78/96]
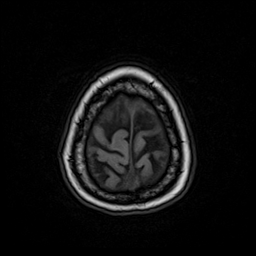
[im 87/96]
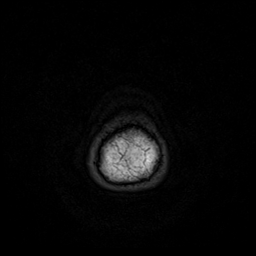
[im 96/96]
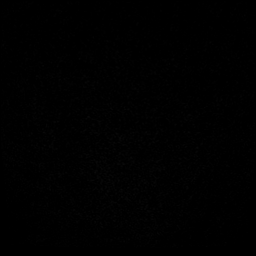

[Series 9: T2 · coronal · 5.0mm · 0.82mm/px · 3 of 28 slices shown (3 of 3)]
[im 1/28]
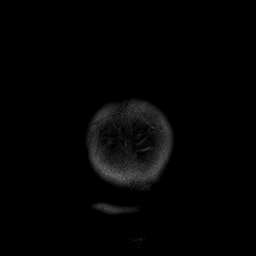
[im 14/28]
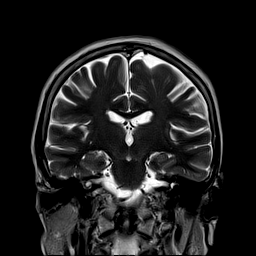
[im 28/28]
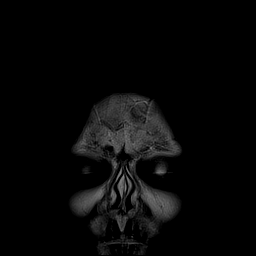

[48 of 48 positions shown; findings below may reference images not displayed]

FINDINGS: Brain: There is no acute infarction or intracranial hemorrhage.
There is no intracranial mass, mass effect, or edema. There is no
hydrocephalus or extra-axial fluid collection. Ventricles and sulci
are within normal limits in size and configuration. Patchy foci of
T2 hyperintensity in the supratentorial white matter are nonspecific
but may reflect mild chronic microvascular ischemic changes.

Vascular: Major vessel flow voids at the skull base are preserved.

Skull and upper cervical spine: Normal marrow signal is preserved.

Sinuses/Orbits: Mild mucosal thickening.  Orbits are unremarkable.

Other: Sella is unremarkable.  Mastoid air cells are clear.
IMPRESSION: No evidence of recent infarction, hemorrhage, or mass. Mild chronic
microvascular ischemic changes.

## 2022-03-28 LAB — FECAL OCCULT BLOOD, GUAIAC: Fecal Occult Blood: NEGATIVE

## 2022-04-16 DIAGNOSIS — Z1231 Encounter for screening mammogram for malignant neoplasm of breast: Secondary | ICD-10-CM | POA: Diagnosis not present

## 2022-04-22 ENCOUNTER — Encounter: Payer: Self-pay | Admitting: Family Medicine

## 2022-04-22 ENCOUNTER — Ambulatory Visit (INDEPENDENT_AMBULATORY_CARE_PROVIDER_SITE_OTHER): Payer: Medicare PPO | Admitting: Family Medicine

## 2022-04-22 VITALS — BP 121/70 | HR 67 | Temp 98.2°F | Ht 64.0 in | Wt 140.5 lb

## 2022-04-22 DIAGNOSIS — M810 Age-related osteoporosis without current pathological fracture: Secondary | ICD-10-CM | POA: Diagnosis not present

## 2022-04-22 DIAGNOSIS — Z0000 Encounter for general adult medical examination without abnormal findings: Secondary | ICD-10-CM | POA: Diagnosis not present

## 2022-04-22 DIAGNOSIS — E7800 Pure hypercholesterolemia, unspecified: Secondary | ICD-10-CM | POA: Diagnosis not present

## 2022-04-22 LAB — COMPREHENSIVE METABOLIC PANEL
ALT: 18 U/L (ref 0–35)
AST: 28 U/L (ref 0–37)
Albumin: 4.6 g/dL (ref 3.5–5.2)
Alkaline Phosphatase: 51 U/L (ref 39–117)
BUN: 10 mg/dL (ref 6–23)
CO2: 27 mEq/L (ref 19–32)
Calcium: 9.7 mg/dL (ref 8.4–10.5)
Chloride: 104 mEq/L (ref 96–112)
Creatinine, Ser: 0.75 mg/dL (ref 0.40–1.20)
GFR: 80.07 mL/min (ref >60.00)
Glucose, Bld: 87 mg/dL (ref 70–99)
Potassium: 4.4 mEq/L (ref 3.5–5.1)
Sodium: 140 mEq/L (ref 135–145)
Total Bilirubin: 0.8 mg/dL (ref 0.2–1.2)
Total Protein: 7.4 g/dL (ref 6.0–8.3)

## 2022-04-22 LAB — VITAMIN D 25 HYDROXY (VIT D DEFICIENCY, FRACTURES): VITD: 39.79 ng/mL (ref 30.00–100.00)

## 2022-04-22 LAB — LIPID PANEL
Cholesterol: 154 mg/dL (ref 0–200)
HDL: 63.1 mg/dL (ref >39.00)
LDL Cholesterol: 68 mg/dL (ref 0–99)
NonHDL: 90.67
Total CHOL/HDL Ratio: 2
Triglycerides: 111 mg/dL (ref 0.0–149.0)
VLDL: 22.2 mg/dL (ref 0.0–40.0)

## 2022-04-22 MED ORDER — FORTEO 600 MCG/2.4ML ~~LOC~~ SOPN: 20.0000 ug | PEN_INJECTOR | Freq: Every day | SUBCUTANEOUS | 5 refills | Status: DC

## 2022-04-22 NOTE — Progress Notes (Signed)
Chief Complaint  Patient presents with   Annual Exam     Well Woman Kayla Savage is here for a complete physical.   Her last physical was >1 year ago.  Current diet: in general, reports diet could be better. Current exercise: walking. Weight is stable and she denies daytime fatigue. Seatbelt? Yes Advanced directive? No  Health Maintenance Colonoscopy- Yes Shingrix- No DEXA- Yes Mammogram- Yes Tetanus- Yes Pneumonia- Yes Hep C screen- Yes  Past Medical History:  Diagnosis Date   Hearing loss    Osteoporosis    Tinnitus      Past Surgical History:  Procedure Laterality Date   FACIAL COSMETIC SURGERY     NOSE SURGERY  10/02/2016   due to injury   TONSILLECTOMY      Medications  Current Outpatient Medications on File Prior to Visit  Medication Sig Dispense Refill   aspirin-acetaminophen-caffeine (EXCEDRIN MIGRAINE) 250-250-65 MG per tablet Take 1 tablet by mouth every 6 (six) hours as needed for pain.     Multiple Vitamin (MULTIVITAMIN) tablet Take 1 tablet by mouth daily.     rosuvastatin (CRESTOR) 10 MG tablet TAKE 1 TABLET BY MOUTH EVERY DAY 90 tablet 2    Allergies Allergies  Allergen Reactions   Boniva [Ibandronic Acid]    Evista [Raloxifene]    Lexapro [Escitalopram Oxalate]     Fatigue, dizzy, insomnia   Other     IV Dye   Prolia [Denosumab]     Review of Systems: Constitutional:  no fevers Eye:  no recent significant change in vision Ears:  No changes in hearing Nose/Mouth/Throat:  no complaints of nasal congestion, no sore throat Cardiovascular: no chest pain Respiratory:  No shortness of breath Gastrointestinal:  No change in bowel habits GU:  Female: negative for dysuria Integumentary:  no abnormal skin lesions reported Neurologic:  no headaches Endocrine:  denies unexplained weight changes  Exam BP 121/70   Pulse 67   Temp 98.2 F (36.8 C) (Oral)   Ht 5\' 4"  (1.626 m)   Wt 140 lb 8 oz (63.7 kg)   LMP 11/23/2004   SpO2 98%   BMI  24.12 kg/m  General:  well developed, well nourished, in no apparent distress Skin:  no significant moles, warts, or growths Head:  no masses, lesions, or tenderness Eyes:  pupils equal and round, sclera anicteric without injection Ears:  canals without lesions, TMs shiny without retraction, no obvious effusion, no erythema Nose:  nares patent, septum midline, mucosa normal, and no drainage or sinus tenderness Throat/Pharynx:  lips and gingiva without lesion; tongue and uvula midline; non-inflamed pharynx; no exudates or postnasal drainage Neck: neck supple without adenopathy, thyromegaly, or masses Lungs:  clear to auscultation, breath sounds equal bilaterally, no respiratory distress Cardio:  regular rate and rhythm, no bruits or LE edema Abdomen:  abdomen soft, nontender; bowel sounds normal; no masses or organomegaly Genital: Deferred Neuro:  gait normal; deep tendon reflexes normal and symmetric Psych: well oriented with normal range of affect and appropriate judgment/insight  Assessment and Plan  Well adult exam  Pure hypercholesterolemia - Plan: Comprehensive metabolic panel, Lipid panel  Age-related osteoporosis without current pathological fracture - Plan: VITAMIN D 25 Hydroxy (Vit-D Deficiency, Fractures), Teriparatide, Recombinant, (FORTEO) 600 MCG/2.4ML SOPN   Well 71 y.o. female. Counseled on diet and exercise. Advanced directive form provided today.  Shingrix rec'd.  Will look into alt therapy for osteoporosis. Will see if she can get on Forteo having failed Prolia, Evista and several bisphosphonates.  Other orders as above. Follow up in 6 mo or prn. The patient voiced understanding and agreement to the plan.  Jilda Roche [place], DO 04/22/22 10:52 AM

## 2022-04-22 NOTE — Patient Instructions (Addendum)
Give [place] 2-3 business days to get the results of your labs back.   Keep the diet clean and stay active.  A few days prior to a stressful event, consider taking OTC magnesium 200 mg daily. OK to stop a couple days after the stressful event.   I recommend getting the flu shot in mid October. This suggestion would change if the CDC comes out with a different recommendation.   Please get me a copy of your advanced directive form at your convenience.   The Shingrix vaccine (for shingles) is a 2 shot series spaced 2-6 months apart. It can make people feel low energy, achy and almost like they have the flu for 48 hours after injection. 1/5 people can have nausea and/or vomiting. Please plan accordingly when deciding on when to get this shot. Call your pharmacy to get this. The second shot of the series is less severe regarding the side effects, but it still lasts 48 hours.   Let us know if you need anything.

## 2022-05-01 DIAGNOSIS — R928 Other abnormal and inconclusive findings on diagnostic imaging of breast: Secondary | ICD-10-CM | POA: Diagnosis not present

## 2022-05-06 ENCOUNTER — Telehealth: Payer: Self-pay | Admitting: Family Medicine

## 2022-05-06 NOTE — Telephone Encounter (Signed)
She thinks she was prescribed this early on when she was only osteopenia

## 2022-05-06 NOTE — Telephone Encounter (Signed)
Received approval through 08/25/2022 Called the patient informed of approval. She states she has had a side effect on this medication and refuses to take.

## 2022-05-06 NOTE — Telephone Encounter (Signed)
PA for Forteo Initiated on cover my meds KEY:  [id number] Waiting response.

## 2022-05-10 NOTE — Telephone Encounter (Signed)
Patient informed of PCP instructions. 

## 2022-06-14 ENCOUNTER — Other Ambulatory Visit: Payer: Self-pay | Admitting: Family Medicine

## 2022-06-14 DIAGNOSIS — E7800 Pure hypercholesterolemia, unspecified: Secondary | ICD-10-CM

## 2022-06-14 DIAGNOSIS — I6782 Cerebral ischemia: Secondary | ICD-10-CM

## 2022-07-04 DIAGNOSIS — D2271 Melanocytic nevi of right lower limb, including hip: Secondary | ICD-10-CM | POA: Diagnosis not present

## 2022-07-04 DIAGNOSIS — I8392 Asymptomatic varicose veins of left lower extremity: Secondary | ICD-10-CM | POA: Diagnosis not present

## 2022-07-04 DIAGNOSIS — L821 Other seborrheic keratosis: Secondary | ICD-10-CM | POA: Diagnosis not present

## 2022-07-04 DIAGNOSIS — E663 Overweight: Secondary | ICD-10-CM | POA: Diagnosis not present

## 2022-10-23 ENCOUNTER — Encounter: Payer: Self-pay | Admitting: Family Medicine

## 2022-10-23 ENCOUNTER — Ambulatory Visit: Payer: Medicare PPO | Admitting: Family Medicine

## 2022-10-23 VITALS — BP 121/82 | HR 97 | Temp 97.7°F | Ht 64.0 in | Wt 139.0 lb

## 2022-10-23 DIAGNOSIS — E7800 Pure hypercholesterolemia, unspecified: Secondary | ICD-10-CM

## 2022-10-23 LAB — COMPREHENSIVE METABOLIC PANEL
ALT: 17 U/L (ref 0–35)
AST: 30 U/L (ref 0–37)
Albumin: 4.3 g/dL (ref 3.5–5.2)
Alkaline Phosphatase: 56 U/L (ref 39–117)
BUN: 15 mg/dL (ref 6–23)
CO2: 26 mEq/L (ref 19–32)
Calcium: 10.1 mg/dL (ref 8.4–10.5)
Chloride: 104 mEq/L (ref 96–112)
Creatinine, Ser: 0.83 mg/dL (ref 0.40–1.20)
GFR: 70.65 mL/min (ref >60.00)
Glucose, Bld: 86 mg/dL (ref 70–99)
Potassium: 4.3 mEq/L (ref 3.5–5.1)
Sodium: 139 mEq/L (ref 135–145)
Total Bilirubin: 0.6 mg/dL (ref 0.2–1.2)
Total Protein: 7.6 g/dL (ref 6.0–8.3)

## 2022-10-23 LAB — LIPID PANEL
Cholesterol: 160 mg/dL (ref 0–200)
HDL: 59.9 mg/dL (ref >39.00)
LDL Cholesterol: 74 mg/dL (ref 0–99)
NonHDL: 99.72
Total CHOL/HDL Ratio: 3
Triglycerides: 129 mg/dL (ref 0.0–149.0)
VLDL: 25.8 mg/dL (ref 0.0–40.0)

## 2022-10-23 NOTE — Patient Instructions (Addendum)
Give [place] 2-3 business days to get the results of your labs back.   Keep the diet clean and stay active.  Double check your records for the tetanus shot. If you have not had one, please go to the pharmacy to get your booster.   Please contact the GI team at: [phone]  Let us know if you need anything.

## 2022-10-23 NOTE — Progress Notes (Signed)
Chief Complaint  Patient presents with   Follow-up    6 month    Subjective: Hyperlipidemia Patient presents for Hyperlipidemia follow up. Currently taking Crestor 10 mg/d and compliance with treatment thus far has been good. She denies myalgias. She is sometimes adhering to a healthy diet. Exercise: none No Cp or SOB.  The patient is not known to have coexisting coronary artery disease.  Past Medical History:  Diagnosis Date   Hearing loss    Osteoporosis    Tinnitus     Objective: BP 121/82 (BP Location: Left Arm, Patient Position: Sitting, Cuff Size: Normal)   Pulse 97   Temp 97.7 F (36.5 C) (Other (Comment))   Ht '5\' 4"'$  (1.626 m)   Wt 139 lb (63 kg)   LMP 11/23/2004   SpO2 97%   BMI 23.86 kg/m  General: Awake, appears stated age 72: MMM Heart: RRR, no LE edema, no bruits Lungs: CTAB, no rales, wheezes or rhonchi. No accessory muscle use Psych: Age appropriate judgment and insight, normal affect and mood  Assessment and Plan: Pure hypercholesterolemia - Plan: Comprehensive metabolic panel, Lipid panel  Chronic, stable. Cont Crestor 10 mg/d. Counseled on diet/exercise.  F/u in 6 mo. The patient voiced understanding and agreement to the plan.  [place], DO 10/23/22  10:13 AM

## 2022-10-25 ENCOUNTER — Encounter: Payer: Self-pay | Admitting: Family Medicine

## 2022-11-05 DIAGNOSIS — H353111 Nonexudative age-related macular degeneration, right eye, early dry stage: Secondary | ICD-10-CM | POA: Diagnosis not present

## 2022-11-05 DIAGNOSIS — H2513 Age-related nuclear cataract, bilateral: Secondary | ICD-10-CM | POA: Diagnosis not present

## 2022-12-10 ENCOUNTER — Encounter: Payer: Self-pay | Admitting: Family Medicine

## 2022-12-10 ENCOUNTER — Ambulatory Visit: Payer: Medicare PPO | Admitting: Family Medicine

## 2022-12-10 VITALS — BP 132/82 | HR 86 | Temp 97.6°F | Ht 64.0 in | Wt 145.2 lb

## 2022-12-10 DIAGNOSIS — R413 Other amnesia: Secondary | ICD-10-CM

## 2022-12-10 DIAGNOSIS — R29898 Other symptoms and signs involving the musculoskeletal system: Secondary | ICD-10-CM | POA: Diagnosis not present

## 2022-12-10 MED ORDER — DIAZEPAM 5 MG PO TABS: ORAL_TABLET | ORAL | 0 refills | Status: DC

## 2022-12-10 MED ORDER — METHYLPREDNISOLONE 4 MG PO TBPK: ORAL_TABLET | ORAL | 0 refills | Status: DC

## 2022-12-10 NOTE — Progress Notes (Signed)
Chief Complaint  Patient presents with   Arm Pain    Subjective: Patient is a 72 y.o. female here for f/u.  Patient is having worsening right arm pain and now having weakness.  She avoids using it due to fear of dropping things.  She has similar issue several years ago and was more worried about a stroke.  Imaging was negative back then.  Has worsened over the past couple months.  No specific injury or change in activity.  Also over the past couple months she has had weakness of her right lower extremity.  No redness, bruising, or swelling.  She has associated difficulty with word finding and memory.  Past Medical History:  Diagnosis Date   Hearing loss    Osteoporosis    Tinnitus     Objective: BP 132/82 (BP Location: Left Arm, Patient Position: Sitting, Cuff Size: Normal)   Pulse 86   Temp 97.6 F (36.4 C) (Oral)   Ht  (1.626 m)   Wt 145 lb 4 oz (65.9 kg)   LMP 11/23/2004   SpO2 97%   BMI 24.93 kg/m  General: Awake, appears stated age Neuro: 24/5 strength with right hip flexion, elbow flexion, elbow extension, and grip strength; 5/5 strength throughout otherwise, DTRs equal and symmetric throughout without clonus, gait is slow and cautious; negative Spurling's MSK: No TTP over trapezius musculature bilaterally, cervical paraspinal musculature bilaterally, suboccipital triangle musculature bilaterally Lungs: No accessory muscle use Psych: Age appropriate judgment and insight, normal affect and mood  Assessment and Plan: Right arm weakness - Plan: MR Cervical Spine Wo Contrast, methylPREDNISolone (MEDROL DOSEPAK) 4 MG TBPK tablet  Memory changes - Plan: MR Brain Wo Contrast, methylPREDNISolone (MEDROL DOSEPAK) 4 MG TBPK tablet  Right leg weakness - Plan: MR Cervical Spine Wo Contrast, methylPREDNISolone (MEDROL DOSEPAK) 4 MG TBPK tablet  Medrol Dosepak today.  Check C spine and brain imaging given above symptoms.  Myelopathy is a concern.  Could consider neurosurgery  referral given symptoms versus sports medicine depending on what her results are. The patient voiced understanding and agreement to the plan.  Jilda Roche [place], DO 12/10/22  12:12 PM

## 2022-12-10 NOTE — Patient Instructions (Signed)
Someone will reach out to schedule your neck/brain scans. We will be in touch regarding your results and proceed accordingly.   Ice/cold pack over area for 10-15 min twice daily.  Heat (pad or rice pillow in microwave) over affected area, 10-15 minutes twice daily.   OK to take Tylenol 1000 mg (2 extra strength tabs) or 975 mg (3 regular strength tabs) every 6 hours as needed.  Let us know if you need anything.

## 2022-12-11 ENCOUNTER — Telehealth: Payer: Self-pay | Admitting: Family Medicine

## 2022-12-11 NOTE — Telephone Encounter (Signed)
Contacted Kayla Savage to schedule their annual wellness visit. Call back at later date: 0506/2024 DUE TO POSSIBLE JURY DUTY  Verlee Rossetti; Care Guide Ambulatory Clinical Support [place] [facility] Direct Dial: [phone]

## 2022-12-13 ENCOUNTER — Other Ambulatory Visit (HOSPITAL_COMMUNITY)
Admission: RE | Admit: 2022-12-13 | Discharge: 2022-12-13 | Disposition: A | Discharge status: Home / Self Care | Payer: Medicare PPO | Source: Ambulatory Visit | Attending: Obstetrics & Gynecology | Admitting: Obstetrics & Gynecology

## 2022-12-13 ENCOUNTER — Ambulatory Visit (INDEPENDENT_AMBULATORY_CARE_PROVIDER_SITE_OTHER): Payer: Medicare PPO | Admitting: Obstetrics & Gynecology

## 2022-12-13 ENCOUNTER — Encounter (HOSPITAL_BASED_OUTPATIENT_CLINIC_OR_DEPARTMENT_OTHER): Payer: Self-pay | Admitting: Obstetrics & Gynecology

## 2022-12-13 VITALS — BP 123/62 | HR 67 | Ht 62.0 in | Wt 145.6 lb

## 2022-12-13 DIAGNOSIS — M810 Age-related osteoporosis without current pathological fracture: Secondary | ICD-10-CM

## 2022-12-13 DIAGNOSIS — K429 Umbilical hernia without obstruction or gangrene: Secondary | ICD-10-CM

## 2022-12-13 DIAGNOSIS — Z780 Asymptomatic menopausal state: Secondary | ICD-10-CM

## 2022-12-13 DIAGNOSIS — Z01419 Encounter for gynecological examination (general) (routine) without abnormal findings: Secondary | ICD-10-CM

## 2022-12-13 DIAGNOSIS — Z124 Encounter for screening for malignant neoplasm of cervix: Secondary | ICD-10-CM | POA: Diagnosis not present

## 2022-12-13 DIAGNOSIS — N898 Other specified noninflammatory disorders of vagina: Secondary | ICD-10-CM

## 2022-12-13 NOTE — Progress Notes (Signed)
72 y.o. G7P2002 Married White or Caucasian female here for breast and pelvic exam.  I am also following her for PMP state.  Denies vaginal bleeding.  Father is 21 and is in independent living last year.  She goes almost every day to see him.     Patient's last menstrual period was 11/23/2004.          Sexually active: No.  H/O STD:  no  Health Maintenance: PCP:  Dr. Carmelia Roller.  Last wellness appt was 03/2022.  Did blood work at that appt:   Vaccines are up to date:  no.  Discussed shingrix with pt today.  Declined. Colonoscopy:  05/29/2012.  Pt reports she did a test through the mail last year.  This was negative.  MMG:  05/01/2022 BMD:  11/29/2021.  T score was -4.2  Discussed with pt today Last pap smear:  01/09/2017.   H/o abnormal pap smear:  remote hx    reports that she has never smoked. She has never used smokeless tobacco. She reports that she does not drink alcohol and does not use drugs.  Past Medical History:  Diagnosis Date   Hearing loss    Osteoporosis    Tinnitus     Past Surgical History:  Procedure Laterality Date   FACIAL COSMETIC SURGERY     NOSE SURGERY  10/02/2016   due to injury   TONSILLECTOMY      Current Outpatient Medications  Medication Sig Dispense Refill   aspirin-acetaminophen-caffeine (EXCEDRIN MIGRAINE) 250-250-65 MG per tablet Take 1 tablet by mouth every 6 (six) hours as needed for pain.     diazepam (VALIUM) 5 MG tablet Take 1 tab 1 hour prior to procedure. May repeat if no improvement. 2 tablet 0   Multiple Vitamin (MULTIVITAMIN) tablet Take 1 tablet by mouth daily.     rosuvastatin (CRESTOR) 10 MG tablet TAKE 1 TABLET BY MOUTH EVERY DAY 90 tablet 2   methylPREDNISolone (MEDROL DOSEPAK) 4 MG TBPK tablet Follow instructions on package. (Patient not taking: Reported on 12/13/2022) 21 tablet 0   No current facility-administered medications for this visit.    Family History  Problem Relation Age of Onset   Breast cancer Maternal Grandmother     Heart disease Other    Parkinson's disease Mother    Stroke Mother    Heart disease Father     Review of Systems  Constitutional: Negative.   Genitourinary: Negative.     Exam:   BP 123/62 (BP Location: Right Arm, Patient Position: Sitting, Cuff Size: Large)   Pulse 67   Ht  (1.575 m) Comment: Reported  Wt 145 lb 9.6 oz (66 kg)   LMP 11/23/2004   BMI 26.63 kg/m   Height:  (157.5 cm) (Reported)  General appearance: alert, cooperative and appears stated age Breasts: normal appearance, no masses or tenderness Abdomen: soft, non-tender; bowel sounds normal; no masses,  no organomegaly, small inferior umbilical hernia (stable in size) Lymph nodes: Cervical, supraclavicular, and axillary nodes normal.  No abnormal inguinal nodes palpated Neurologic: Grossly normal  Pelvic: External genitalia:  no lesions              Urethra:  normal appearing urethra with no masses, tenderness or lesions              Bartholins and Skenes: normal                 Vagina: normal appearing vagina with atrophic changes and no discharge, posterior  vaginal wall cyst 4mm              Cervix: no lesions              Pap taken: Yes.   Bimanual Exam:  Uterus:  normal size, contour, position, consistency, mobility, non-tender              Adnexa: normal adnexa and no mass, fullness, tenderness               Rectovaginal: Confirms               Anus:  normal sphincter tone, no lesions  Chaperone, Ina Homes, CMA, was present for exam.  Assessment/Plan: 1. Encntr for gyn exam (general) (routine) w/o abn findings - Pap smear obtained today - Mammogram 04/2022 - Colonoscopy 2013.  Pt reports doing a stool test in the mail and she is going to try and get me a copy of it - Bone mineral density reviewed - lab work done with PCP, Dr. Carmelia Roller - vaccines reviewed/updated  2. Cervical cancer screening - Cytology - PAP( [place]) - PR OBTAINING SCREEN PAP SMEAR  3. Umbilical hernia  4.  Age-related osteoporosis without current pathological fracture - endocrinologist referral recommended.  Has failed fosamax, prolia, evista, and boniva.  She just does not want to be on medications.  She is aware her fracture risk is elevated but declines therapy.  5. Postmenopausal - not on HRT  6.  Vaginal wall cyst

## 2022-12-16 ENCOUNTER — Telehealth (HOSPITAL_BASED_OUTPATIENT_CLINIC_OR_DEPARTMENT_OTHER): Payer: Self-pay | Admitting: Obstetrics & Gynecology

## 2022-12-16 NOTE — Telephone Encounter (Signed)
Patient called and would like for the nurse to please call her back .

## 2022-12-19 LAB — CYTOLOGY - PAP: Diagnosis: NEGATIVE

## 2022-12-21 ENCOUNTER — Ambulatory Visit (HOSPITAL_BASED_OUTPATIENT_CLINIC_OR_DEPARTMENT_OTHER): Payer: Medicare PPO

## 2022-12-23 ENCOUNTER — Telehealth: Payer: Self-pay | Admitting: Family Medicine

## 2022-12-23 ENCOUNTER — Other Ambulatory Visit: Payer: Self-pay | Admitting: Family Medicine

## 2022-12-23 MED ORDER — DIAZEPAM 5 MG PO TABS: ORAL_TABLET | ORAL | 0 refills | Status: DC

## 2022-12-23 NOTE — Telephone Encounter (Signed)
Patient states she took her pills before her MRI but it ended up not happening since the machine was broken. She states she's getting it done this week but will need more pills sent. Please advise.

## 2022-12-23 NOTE — Telephone Encounter (Signed)
Patient informed. 

## 2022-12-29 ENCOUNTER — Ambulatory Visit (HOSPITAL_BASED_OUTPATIENT_CLINIC_OR_DEPARTMENT_OTHER)
Admission: RE | Admit: 2022-12-29 | Discharge: 2022-12-29 | Disposition: A | Discharge status: Home / Self Care | Payer: Medicare PPO | Source: Ambulatory Visit | Attending: Family Medicine | Admitting: Family Medicine

## 2022-12-29 DIAGNOSIS — R29898 Other symptoms and signs involving the musculoskeletal system: Secondary | ICD-10-CM

## 2022-12-29 DIAGNOSIS — R519 Headache, unspecified: Secondary | ICD-10-CM | POA: Diagnosis not present

## 2022-12-29 DIAGNOSIS — R413 Other amnesia: Secondary | ICD-10-CM | POA: Insufficient documentation

## 2023-01-03 ENCOUNTER — Other Ambulatory Visit: Payer: Self-pay | Admitting: Family Medicine

## 2023-01-03 DIAGNOSIS — M5412 Radiculopathy, cervical region: Secondary | ICD-10-CM

## 2023-01-09 ENCOUNTER — Ambulatory Visit: Payer: Medicare PPO | Admitting: Orthopedic Surgery

## 2023-01-09 ENCOUNTER — Other Ambulatory Visit: Payer: Self-pay

## 2023-01-09 ENCOUNTER — Encounter: Payer: Self-pay | Admitting: Orthopedic Surgery

## 2023-01-09 VITALS — BP 119/78 | HR 72 | Ht 62.0 in | Wt 146.0 lb

## 2023-01-09 DIAGNOSIS — M542 Cervicalgia: Secondary | ICD-10-CM

## 2023-01-09 NOTE — Progress Notes (Signed)
Orthopedic Spine Surgery Office Note  Assessment: Patient is a 72 y.o. female with neck pain that radiates into the right upper extremity.  Also has weakness in the right upper extremity.  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   Plan: -Patient has tried physical therapy -Discussed her treatment options including but not limited to: Tylenol, NSAIDs, steroid injection, gabapentin, Lyrica, or decompression surgery.  Patient was not interested in any of these treatments except Tylenol due to concern for side effects.  She said she would absolutely not do surgery because she had complications with her no surgery. -Patient should return to office on an as-needed basis   Patient expressed understanding of the plan and all questions were answered to the patient's satisfaction.   ___________________________________________________________________________   History:  Patient is a 72 y.o. female who presents today for cervical spine.  Patient has had 2 years of neck pain that radiates into the right upper extremity.  She feels it throughout the arm in multiple distributions and it goes to the fingertips.  She also feels that her right arm has gotten weaker with time.  She cannot lift as much with the right arm as she can with the left.  She feels weakness with gripping objects or taking jars off of the right hand.  She has not noticed any functional limitations with her left upper extremity.  She does not have any pain radiating into the left upper extremity.  There is no trauma or injury that preceded the onset of pain.   Weakness: Yes, feels her right arm is weak in multiple myotomes.  No other weakness noted Difficulty with fine motor skills (e.g., buttoning shirts, handwriting): Denies Symptoms of imbalance: Denies Paresthesias and numbness: Yes, has numbness and tingling in the hand distal to the wrist on the right side.  Has history of carpal tunnel syndrome and used night braces in the past.   No other numbness or paresthesias Bowel or bladder incontinence: Denies Saddle anesthesia: Denies  Treatments tried: Physical therapy  Review of systems: Denies fevers and chills, night sweats, unexplained weight loss, history of cancer.  Has had pain that wakes her at night  Past medical history: Depression/anxiety Migraines Osteoporosis Chronic pain  Allergies: Boniva, Evista, Lexapro, IV contrast, Prolia  Past surgical history:  Tonsillectomy Nose surgery  Social history: Denies use of nicotine product (smoking, vaping, patches, smokeless) Alcohol use: Denies Denies recreational drug use  Physical Exam:  BMI of 26.7  General: no acute distress, appears stated age Neurologic: alert, answering questions appropriately, following commands Respiratory: unlabored breathing on room air, symmetric chest rise Psychiatric: appropriate affect, normal cadence to speech   MSK (spine):  -Strength exam      Left  Right Grip strength               5/5  4+/5 Interosseus   5/5   4+/5 Wrist extension  5/5  5/5 Wrist flexion   5/5  4+/5 Elbow flexion   5/5  4+/5 Deltoid    5/5  4+/5  EHL    5/5  5/5 TA    5/5  5/5 GSC    5/5  5/5 Knee extension  5/5  5/5 Hip flexion   5/5  5/5  -Sensory exam    Sensation intact to light touch in L3-S1 nerve distributions of bilateral lower extremities  Sensation intact to light touch in C5-T1 nerve distributions of bilateral upper extremities  -Brachioradialis DTR: 2/4 on the left, 2/4 on the right -  Biceps DTR: 2/4 on the left, 2/4 on the right -Achilles DTR: 2/4 on the left, 2/4 on the right -Patellar tendon DTR: 2/4 on the left, 2/4 on the right  -Spurling: Negative bilaterally -Hoffman sign: Negative bilaterally -Clonus: No beats bilaterally -Interosseous wasting: None seen -Grip and release test: Negative -Romberg: Negative -Gait: Normal  Left shoulder exam: No pain through range of motion Right shoulder exam: No pain  through range of motion, no pain with Jobe but has weakness, negative drop arm sign, negative belly press, no weakness with external rotation with arm at side  Tinel's at wrist: Negative bilaterally Phalen's at wrist: Negative bilaterally Durkan's: Positive on the right, negative on the left  Tinel's at elbow: Negative bilaterally  Imaging: XR of the cervical spine from 01/09/2023 was independently reviewed and interpreted, showing disc height loss at C5/6 and C6/7. No fracture or dislocation seen. No evidence of instability on flexion/extension views.   MRI of the cervical spine from 12/29/2022 was independently reviewed and interpreted, showing DDD at C5/6 and C6/7. Foraminal stenosis bilaterally at C5/6 and C6/7 (L>R at both levels). No central stenosis. No T2 cord signal change.    Patient name: Kayla Savage Patient MRN: [id number] Date of visit: 01/09/23

## 2023-03-18 ENCOUNTER — Other Ambulatory Visit: Payer: Self-pay | Admitting: Family Medicine

## 2023-03-18 DIAGNOSIS — I6782 Cerebral ischemia: Secondary | ICD-10-CM

## 2023-03-18 DIAGNOSIS — E7800 Pure hypercholesterolemia, unspecified: Secondary | ICD-10-CM

## 2023-03-26 ENCOUNTER — Encounter (INDEPENDENT_AMBULATORY_CARE_PROVIDER_SITE_OTHER): Payer: Self-pay

## 2023-04-25 ENCOUNTER — Encounter: Payer: Medicare PPO | Admitting: Family Medicine

## 2023-04-29 DIAGNOSIS — Z1231 Encounter for screening mammogram for malignant neoplasm of breast: Secondary | ICD-10-CM | POA: Diagnosis not present

## 2023-04-29 LAB — HM MAMMOGRAPHY

## 2023-05-21 ENCOUNTER — Ambulatory Visit: Payer: Medicare PPO

## 2023-05-21 ENCOUNTER — Encounter: Payer: Self-pay | Admitting: Family Medicine

## 2023-05-21 ENCOUNTER — Ambulatory Visit (INDEPENDENT_AMBULATORY_CARE_PROVIDER_SITE_OTHER): Payer: Medicare PPO | Admitting: Family Medicine

## 2023-05-21 VITALS — BP 132/80 | HR 73 | Temp 98.1°F | Ht 64.0 in | Wt 145.0 lb

## 2023-05-21 DIAGNOSIS — I6782 Cerebral ischemia: Secondary | ICD-10-CM

## 2023-05-21 DIAGNOSIS — M7731 Calcaneal spur, right foot: Secondary | ICD-10-CM | POA: Diagnosis not present

## 2023-05-21 DIAGNOSIS — E7800 Pure hypercholesterolemia, unspecified: Secondary | ICD-10-CM | POA: Diagnosis not present

## 2023-05-21 DIAGNOSIS — M7989 Other specified soft tissue disorders: Secondary | ICD-10-CM | POA: Diagnosis not present

## 2023-05-21 DIAGNOSIS — M25571 Pain in right ankle and joints of right foot: Secondary | ICD-10-CM

## 2023-05-21 DIAGNOSIS — M810 Age-related osteoporosis without current pathological fracture: Secondary | ICD-10-CM

## 2023-05-21 DIAGNOSIS — Z1211 Encounter for screening for malignant neoplasm of colon: Secondary | ICD-10-CM

## 2023-05-21 DIAGNOSIS — Z0000 Encounter for general adult medical examination without abnormal findings: Secondary | ICD-10-CM

## 2023-05-21 DIAGNOSIS — Z0001 Encounter for general adult medical examination with abnormal findings: Secondary | ICD-10-CM | POA: Diagnosis not present

## 2023-05-21 LAB — LIPID PANEL
Cholesterol: 155 mg/dL (ref 0–200)
HDL: 61.7 mg/dL (ref >39.00)
LDL Cholesterol: 60 mg/dL (ref 0–99)
NonHDL: 93.73
Total CHOL/HDL Ratio: 3
Triglycerides: 167 mg/dL — ABNORMAL HIGH (ref 0.0–149.0)
VLDL: 33.4 mg/dL (ref 0.0–40.0)

## 2023-05-21 LAB — COMPREHENSIVE METABOLIC PANEL
ALT: 14 U/L (ref 0–35)
AST: 27 U/L (ref 0–37)
Albumin: 4.5 g/dL (ref 3.5–5.2)
Alkaline Phosphatase: 56 U/L (ref 39–117)
BUN: 12 mg/dL (ref 6–23)
CO2: 28 mEq/L (ref 19–32)
Calcium: 9.8 mg/dL (ref 8.4–10.5)
Chloride: 104 mEq/L (ref 96–112)
Creatinine, Ser: 0.75 mg/dL (ref 0.40–1.20)
GFR: 79.47 mL/min (ref >60.00)
Glucose, Bld: 88 mg/dL (ref 70–99)
Potassium: 4.4 mEq/L (ref 3.5–5.1)
Sodium: 139 mEq/L (ref 135–145)
Total Bilirubin: 0.7 mg/dL (ref 0.2–1.2)
Total Protein: 7.5 g/dL (ref 6.0–8.3)

## 2023-05-21 LAB — CBC
HCT: 41.9 % (ref 36.0–46.0)
Hemoglobin: 13.6 g/dL (ref 12.0–15.0)
MCHC: 32.3 g/dL (ref 30.0–36.0)
MCV: 89.4 fl (ref 78.0–100.0)
Platelets: 308 10*3/uL (ref 150.0–400.0)
RBC: 4.69 Mil/uL (ref 3.87–5.11)
RDW: 13.4 % (ref 11.5–15.5)
WBC: 8.1 10*3/uL (ref 4.0–10.5)

## 2023-05-21 LAB — VITAMIN D 25 HYDROXY (VIT D DEFICIENCY, FRACTURES): VITD: 39.56 ng/mL (ref 30.00–100.00)

## 2023-05-21 NOTE — Patient Instructions (Addendum)
Give [place] 2-3 business days to get the results of your labs back.   Keep the diet clean and stay active.  Aim to do some physical exertion for 150 minutes per week. This is typically divided into 5 days per week, 30 minutes per day. The activity should be enough to get your heart rate up. Anything is better than nothing if you have time constraints.  Please get your X-ray done at the [facility] in [place]: [address], [place], [place] [address] [phone]  You do not need an appointment for this location.   The Shingrix vaccine (for shingles) is a 2 shot series spaced 2-6 months apart. It can make people feel low energy, achy and almost like they have the flu for 48 hours after injection. 1/5 people can have nausea and/or vomiting. Please plan accordingly when deciding on when to get this shot. Call your pharmacy to get this. The second shot of the series is less severe regarding the side effects, but it still lasts 48 hours.   Consider getting the tetanus booster at the pharmacy.   We will be in touch regarding your film.   Please get me a copy of your advanced directive form at your convenience.   Let us know if you need anything.  Ankle Exercises It is normal to feel mild stretching, pulling, tightness, or discomfort as you do these exercises, but you should stop right away if you feel sudden pain or your pain gets worse.  Stretching and range of motion exercises These exercises warm up your muscles and joints and improve the movement and flexibility of your ankle. These exercises also help to relieve pain, numbness, and tingling. Exercise A: Dorsiflexion/Plantar Flexion    Sit with your affected knee straight or bent. Do not rest your foot on anything. Flex your affected ankle to tilt the top of your foot toward your shin. Hold this position for 5 seconds. Point your toes downward to tilt the top of your foot away from your shin. Hold this position for 5  seconds. Repeat 2 times. Complete this exercise 3 times per week. Exercise B: Ankle Alphabet    Sit with your affected foot supported at your lower leg. Do not rest your foot on anything. Make sure your foot has room to move freely. Think of your affected foot as a paintbrush, and move your foot to trace each letter of the alphabet in the air. Keep your hip and knee still while you trace. Make the letters as large as you can without increasing any discomfort. Trace every letter from A to Z. Repeat 2 times. Complete this exercise 3 times per week. Strengthening exercises These exercises build strength and endurance in your ankle. Endurance is the ability to use your muscles for a long time, even after they get tired. Exercise D: Dorsiflexors    Secure a rubber exercise band or tube to an object, such as a table leg, that will stay still when the band is pulled. Secure the other end around your affected foot. Sit on the floor, facing the object with your affected leg extended. The band or tube should be slightly tense when your foot is relaxed. Slowly flex your affected ankle and toes to bring your foot toward you. Hold this position for 3 seconds.  Slowly return your foot to the starting position, controlling the band as you do that. Do a total of 10 repetitions. Repeat 2 times. Complete this exercise 3 times per week.  Exercise E: Plantar Flexors    Sit on the floor with your affected leg extended. Loop a rubber exercise band or tube around the ball of your affected foot. The ball of your foot is on the walking surface, right under your toes. The band or tube should be slightly tense when your foot is relaxed. Slowly point your toes downward, pushing them away from you. Hold this position for 3 seconds. Slowly release the tension in the band or tube, controlling smoothly until your foot is back in the starting position. Repeat for a total of 10 repetitions. Repeat 2 times. Complete this  exercise 3 times per week. Exercise F: Towel Curls    Sit in a chair on a non-carpeted surface, and put your feet on the floor. Place a towel in front of your feet.  Keeping your heel on the floor, put your affected foot on the towel. Pull the towel toward you by grabbing the towel with your toes and curling them under. Keep your heel on the floor. Let your toes relax. Grab the towel again. Keep going until the towel is completely underneath your foot. Repeat for a total of 10 repetitions. Repeat 2 times. Complete this exercise 3 times per week. Exercise G: Heel Raise ( Plantar Flexors, Standing)     Stand with your feet shoulder-width apart. Keep your weight spread evenly over the width of your feet while you rise up on your toes. Use a wall or table to steady yourself, but try not to use it for support. If this exercise is too easy, try these options: Shift your weight toward your affected leg until you feel challenged. If told by your health care provider, lift your uninjured leg off the floor. Hold this position for 3 seconds. Repeat for a total of 10 repetitions. Repeat 2 times. Complete this exercise 3 times per week. Exercise H: Tandem Walking Stand with one foot directly in front of the other. Slowly raise your back foot up, lifting your heel before your toes, and place it directly in front of your other foot. Continue to walk in this heel-to-toe way for 10 steps or for as long as told by your health care provider. Have a countertop or wall nearby to use if needed to keep your balance, but try not to hold onto anything for support. Repeat 2 times. Complete this exercises 3 times per week. Make sure you discuss any questions you have with your health care provider. Document Released: 06/26/2005 Document Revised: 04/11/2016 Document Reviewed: 04/30/2015 Elsevier Interactive Patient Education  2018 [employer].

## 2023-05-21 NOTE — Progress Notes (Signed)
Chief Complaint  Patient presents with   Annual Exam     Well Woman Kayla Savage is here for a complete physical.   Her last physical was >1 year ago.  Current diet: in general, diet is OK. Current exercise: none. Weight is stable and she denies daytime fatigue. Seatbelt? Yes Advanced directive? No  Health Maintenance Colonoscopy- Due Shingrix- No DEXA- Yes Mammogram- Yes Tetanus- Due Pneumonia- Yes Hep C screen- Yes  R ankle pain Inverted ankle 1130 PM last night.  She did fall but did not lose consciousness or hit her head.  She has swelling, pain, inability to walk or bear weight.  She does have a history of osteoporosis.  She has not tried anything at home so far other than Tylenol.  No neurologic signs or symptoms.  Past Medical History:  Diagnosis Date   Hearing loss    Osteoporosis    Tinnitus      Past Surgical History:  Procedure Laterality Date   FACIAL COSMETIC SURGERY     NOSE SURGERY  10/02/2016   due to injury   TONSILLECTOMY      Medications  Current Outpatient Medications on File Prior to Visit  Medication Sig Dispense Refill   aspirin-acetaminophen-caffeine (EXCEDRIN MIGRAINE) 250-250-65 MG per tablet Take 1 tablet by mouth every 6 (six) hours as needed for pain.     Multiple Vitamin (MULTIVITAMIN) tablet Take 1 tablet by mouth daily.     rosuvastatin (CRESTOR) 10 MG tablet TAKE 1 TABLET BY MOUTH EVERY DAY 90 tablet 2   Allergies Allergies  Allergen Reactions   Boniva [Ibandronic Acid]    Evista [Raloxifene]    Lexapro [Escitalopram Oxalate]     Fatigue, dizzy, insomnia   Other     IV Dye   Prolia [Denosumab]     Review of Systems: Constitutional:  no fevers Eye:  no recent significant change in vision Ears:  No changes in hearing Nose/Mouth/Throat:  no complaints of nasal congestion, no sore throat Cardiovascular: no chest pain Respiratory:  No shortness of breath Gastrointestinal:  No change in bowel habits GU:  Female: negative  for dysuria Integumentary:  no abnormal skin lesions reported MSK: + Right ankle pain Neurologic:  no headaches Endocrine:  denies unexplained weight changes  Exam BP 132/80 (BP Location: Left Arm, Patient Position: Sitting, Cuff Size: Normal)   Pulse 73   Temp 98.1 F (36.7 C) (Oral)   Ht 5\' 4"  (1.626 m)   Wt 145 lb (65.8 kg)   LMP 11/23/2004   SpO2 99%   BMI 24.89 kg/m  General:  well developed, well nourished, in no apparent distress Skin:  no significant moles, warts, or growths Head:  no masses, lesions, or tenderness Eyes:  pupils equal and round, sclera anicteric without injection Ears:  canals without lesions, TMs shiny without retraction, no obvious effusion, no erythema Nose:  nares patent, mucosa normal, and no drainage Throat/Pharynx:  lips and gingiva without lesion; tongue and uvula midline; non-inflamed pharynx; no exudates or postnasal drainage Neck: neck supple without adenopathy, thyromegaly, or masses Lungs:  clear to auscultation, breath sounds equal bilaterally, no respiratory distress Cardio:  regular rate and rhythm, no bruits or LE edema Abdomen:  abdomen soft, nontender; bowel sounds normal; no masses or organomegaly Genital: Deferred MSK: + Edema of the right lateral ankle with TTP over the base of the fifth metatarsal and ATFL.  Negative squeeze test, no TTP over the lateral compartment musculature or proximal fibular head. Neuro: She was  in a wheelchair so unable to assess gait; deep tendon reflexes normal and symmetric Psych: well oriented with normal range of affect and appropriate judgment/insight  Assessment and Plan  Well adult exam  Acute right ankle pain - Plan: DG Ankle Complete Right   Well 72 y.o. female. Counseled on diet and exercise. Advanced directive form provided today.  Tdap/Shingrix rec'd to get at pharmacy. Cologard ordered.  Right ankle pain: TTP over the fifth metatarsal base.  Check x-ray to rule out avulsion fracture.   Stretches and exercises were provided assuming she does not have a fracture or dislocation.  Ice, compression, elevation, Tylenol. Other orders as above. Follow up in 6 months or as needed. The patient voiced understanding and agreement to the plan.  Jilda Roche [place], DO 05/21/23 1:57 PM

## 2023-05-23 ENCOUNTER — Telehealth: Payer: Self-pay | Admitting: Family Medicine

## 2023-05-23 NOTE — Telephone Encounter (Signed)
Patients husband informed of PCP response He verbalized understanding.

## 2023-05-23 NOTE — Telephone Encounter (Signed)
Thayer Ohm (spouse DPR Ok) called to follow up on pts imaging. Advised that radiologist had not read the images yet due to the "Exam Ended" status. He stated that Dr. Carmelia Roller had said something during pt appt about looking at the images before the radiologist to give her some insight. Advised a message would be sent back to look into this and we would call him back.

## 2023-05-27 DIAGNOSIS — Z1211 Encounter for screening for malignant neoplasm of colon: Secondary | ICD-10-CM | POA: Diagnosis not present

## 2023-06-01 LAB — COLOGUARD: COLOGUARD: NEGATIVE

## 2023-06-03 DIAGNOSIS — L814 Other melanin hyperpigmentation: Secondary | ICD-10-CM | POA: Diagnosis not present

## 2023-06-03 DIAGNOSIS — L820 Inflamed seborrheic keratosis: Secondary | ICD-10-CM | POA: Diagnosis not present

## 2023-07-02 DIAGNOSIS — M272 Inflammatory conditions of jaws: Secondary | ICD-10-CM | POA: Diagnosis not present

## 2023-11-04 DIAGNOSIS — J029 Acute pharyngitis, unspecified: Secondary | ICD-10-CM | POA: Diagnosis not present

## 2023-11-04 DIAGNOSIS — R051 Acute cough: Secondary | ICD-10-CM | POA: Diagnosis not present

## 2023-11-18 ENCOUNTER — Ambulatory Visit: Payer: Medicare PPO | Admitting: Family Medicine

## 2023-11-18 ENCOUNTER — Encounter: Payer: Self-pay | Admitting: Family Medicine

## 2023-11-18 ENCOUNTER — Other Ambulatory Visit: Payer: Self-pay | Admitting: Family Medicine

## 2023-11-18 VITALS — BP 128/86 | HR 73 | Temp 97.6°F | Ht 64.0 in | Wt 140.6 lb

## 2023-11-18 DIAGNOSIS — K429 Umbilical hernia without obstruction or gangrene: Secondary | ICD-10-CM

## 2023-11-18 DIAGNOSIS — R0609 Other forms of dyspnea: Secondary | ICD-10-CM | POA: Diagnosis not present

## 2023-11-18 DIAGNOSIS — E7800 Pure hypercholesterolemia, unspecified: Secondary | ICD-10-CM | POA: Diagnosis not present

## 2023-11-18 LAB — CBC
HCT: 42 % (ref 36.0–46.0)
Hemoglobin: 14 g/dL (ref 12.0–15.0)
MCHC: 33.3 g/dL (ref 30.0–36.0)
MCV: 88.8 fl (ref 78.0–100.0)
Platelets: 346 10*3/uL (ref 150.0–400.0)
RBC: 4.73 Mil/uL (ref 3.87–5.11)
RDW: 13 % (ref 11.5–15.5)
WBC: 7.9 10*3/uL (ref 4.0–10.5)

## 2023-11-18 LAB — COMPREHENSIVE METABOLIC PANEL
ALT: 13 U/L (ref 0–35)
AST: 24 U/L (ref 0–37)
Albumin: 4.7 g/dL (ref 3.5–5.2)
Alkaline Phosphatase: 66 U/L (ref 39–117)
BUN: 13 mg/dL (ref 6–23)
CO2: 28 meq/L (ref 19–32)
Calcium: 10 mg/dL (ref 8.4–10.5)
Chloride: 104 meq/L (ref 96–112)
Creatinine, Ser: 0.68 mg/dL (ref 0.40–1.20)
GFR: 86.63 mL/min (ref >60.00)
Glucose, Bld: 87 mg/dL (ref 70–99)
Potassium: 4.5 meq/L (ref 3.5–5.1)
Sodium: 139 meq/L (ref 135–145)
Total Bilirubin: 0.6 mg/dL (ref 0.2–1.2)
Total Protein: 7.7 g/dL (ref 6.0–8.3)

## 2023-11-18 LAB — LIPID PANEL
Cholesterol: 158 mg/dL (ref 0–200)
HDL: 61.1 mg/dL (ref >39.00)
LDL Cholesterol: 74 mg/dL (ref 0–99)
NonHDL: 96.8
Total CHOL/HDL Ratio: 3
Triglycerides: 114 mg/dL (ref 0.0–149.0)
VLDL: 22.8 mg/dL (ref 0.0–40.0)

## 2023-11-18 MED ORDER — ROSUVASTATIN CALCIUM 10 MG PO TABS: 10.0000 mg | ORAL_TABLET | Freq: Every day | ORAL | 3 refills | Status: AC

## 2023-11-18 NOTE — Progress Notes (Signed)
 Chief Complaint  Patient presents with   Medical Management of Chronic Issues    Patient presents today for a 6 month follow-up   Quality Metric Gaps    AWV,TDAP    Subjective: Hyperlipidemia Patient presents for Hyperlipidemia follow up. Currently taking Crestor 10 mg/d and compliance with treatment thus far has been good. She denies myalgias. She is usually adhering to a healthy diet. Exercise: walking No CP. She does get SOB with exertion over the past several months.  No swelling or coughing. The patient is not known to have coexisting coronary artery disease.  Over the past mo, has had pain over umbilicus when she stands up.  This started not long after having a severe upper respiratory infection.  That is finally improving but the abdominal symptoms are lingering.  Hx of umbilical hernia. No trauma. Eating and drinking normally. No overlying skin changes or fevers. Sometimes it comes when she is walking. She has to sit down. Bowel movements are normal.   Past Medical History:  Diagnosis Date   Hearing loss    Osteoporosis    Tinnitus     Objective: BP 128/86   Pulse 73   Temp 97.6 F (36.4 C)   Ht 5\' 4"  (1.626 m)   Wt 140 lb 9.6 oz (63.8 kg)   LMP 11/23/2004   SpO2 98%   BMI 24.13 kg/m  General: Awake, appears stated age Heart: RRR, no LE edema, no bruits Lungs: CTAB, no rales, wheezes or rhonchi. No accessory muscle use Abdomen: Bowel sounds present, soft, bulge increasing with Valsalva in the umbilicus region.  Slight TTP.  No overlying skin color changes.  No erythema or excessive warmth. Psych: Age appropriate judgment and insight, normal affect and mood  Assessment and Plan: Pure hypercholesterolemia - Plan: rosuvastatin (CRESTOR) 10 MG tablet, Comprehensive metabolic panel, Lipid panel  Umbilical hernia without obstruction and without gangrene - Plan: Ambulatory referral to General Surgery  DOE (dyspnea on exertion) - Plan: CBC, EKG 12-Lead  Chronic,  stable.  Continue Crestor 10 mg daily.  Counseled on diet and exercise.  Check labs as above. History of asymptomatic umbilical hernia.  Refer to general surgery for their opinion.  Likely cause irritation when she had the upper EXTR infection. EKG shows NSR, normal axis, no interval abnormalities, no ST segment or T wave changes, good R wave progression.  Check blood count.  If normal, will check an echo.  If that is abnormal, will consider pulmonary causes. F/u in 6 months or as needed pending above. The patient voiced understanding and agreement to the plan.  I spent 33 minutes with the patient discussing the above plans in addition to reviewing her chart on the same day of the visit.  Jilda Roche [place], DO 11/18/23  10:44 AM

## 2023-11-18 NOTE — Patient Instructions (Addendum)
 Give [place] 2-3 business days to get the results of your labs back.   Keep the diet clean and stay active.  If you do not hear anything about your referral in the next 1-2 weeks, call our office and ask for an update.  Let us know if you need anything.  Pectoralis Major Rehab Ask your health care provider which exercises are safe for you. Do exercises exactly as told by your health care provider and adjust them as directed. It is normal to feel mild stretching, pulling, tightness, or discomfort as you do these exercises, but you should stop right away if you feel sudden pain or your pain gets worse. Do not begin these exercises until told by your health care provider. Stretching and range of motion exercises These exercises warm up your muscles and joints and improve the movement and flexibility of your shoulder. These exercises can also help to relieve pain, numbness, and tingling. Exercise A: Pendulum  Stand near a wall or a surface that you can hold onto for balance. Bend at the waist and let your left / right arm hang straight down. Use your other arm to keep your balance. Relax your arm and shoulder muscles, and move your hips and your trunk so your left / right arm swings freely. Your arm should swing because of the motion of your body, not because you are using your arm or shoulder muscles. Keep moving so your arm swings in the following directions, as told by your health care provider: Side to side. Forward and backward. In clockwise and counterclockwise circles. Slowly return to the starting position. Repeat 2 times. Complete this exercise 3 times per week. Exercise B: Abduction, standing Stand and hold a broomstick, a cane, or a similar object. Place your hands a little more than shoulder-width apart on the object. Your left / right hand should be palm-up, and your other hand should be palm-down. While keeping your elbow straight and your shoulder muscles relaxed, push the stick across  your body toward your left / right side. Raise your left / right arm to the side of your body and then over your head until you feel a stretch in your shoulder. Stop when you reach the angle that is recommended by your health care provider. Avoid shrugging your shoulder while you raise your arm. Keep your shoulder blade tucked down toward the middle of your spine. Hold for 10 seconds. Slowly return to the starting position. Repeat 2 times. Complete this exercise 3 times per week. Exercise C: Wand flexion, supine  Lie on your back. You may bend your knees for comfort. Hold a broomstick, a cane, or a similar object so that your hands are about shoulder-width apart on the object. Your palms should face toward your feet. Raise your left / right arm in front of your face, then behind your head (toward the floor). Use your other hand to help you do this. Stop when you feel a gentle stretch in your shoulder, or when you reach the angle that is recommended by your health care provider. Hold for 3 seconds. Use the broomstick and your other arm to help you return your left / right arm to the starting position. Repeat 2 times. Complete this exercise 3 times per week. Exercise D: Wand shoulder external rotation Stand and hold a broomstick, a cane, or a similar object so your hands are about shoulder-width apart on the object. Start with your arms hanging down, then bend both elbows to an "  L" shape (90 degrees). Keep your left / right elbow at your side. Use your other hand to push the stick so your left / right forearm moves away from your body, out to your side. Keep your left / right elbow bent to 90 degrees and keep it against your side. Stop when you feel a gentle stretch in your shoulder, or when you reach the angle recommended by your health care provider. Hold for 10 seconds. Use the stick to help you return your left / right arm to the starting position. Repeat 2 times. Complete this exercise 3  times per week. Strengthening exercises These exercises build strength and endurance in your shoulder. Endurance is the ability to use your muscles for a long time, even after your muscles get tired. Exercise E: Scapular protraction, standing Stand so you are facing a wall. Place your feet about one arm-length away from the wall. Place your hands on the wall and straighten your elbows. Keep your hands on the wall as you push your upper back away from the wall. You should feel your shoulder blades sliding forward. Keep your elbows and your head still. If you are not sure that you are doing this exercise correctly, ask your health care provider for more instructions. Hold for 3 seconds. Slowly return to the starting position. Let your muscles relax completely before you repeat this exercise. Repeat 2 times. Complete this exercise 3 times per week. Exercise F: Shoulder blade squeezes  (scapular retraction) Sit with good posture in a stable chair. Do not let your back touch the back of the chair. Your arms should be at your sides with your elbows bent. You may rest your forearms on a pillow if that is more comfortable. Squeeze your shoulder blades together. Bring them down and back. Keep your shoulders level. Do not lift your shoulders up toward your ears. Hold for 3 seconds. Return to the starting position. Repeat 2 times. Complete this exercise 3 times per week. This information is not intended to replace advice given to you by your health care provider. Make sure you discuss any questions you have with your health care provider. Document Released: 08/12/2005 Document Revised: 05/23/2016 Document Reviewed: 04/30/2015 Elsevier Interactive Patient Education  [employer].

## 2023-11-27 ENCOUNTER — Telehealth: Payer: Self-pay | Admitting: Family Medicine

## 2023-11-27 NOTE — Telephone Encounter (Signed)
 Copied from CRM [id number]. Topic: General - Other >> Nov 27, 2023 10:32 AM Pascal Lux wrote: Reason for CRM: [place] with [facility] called to advised provider that patient is scheduled for their consultation - Thursday April 10th at 10:20am with Dr. Dossie Der.

## 2023-11-27 NOTE — Telephone Encounter (Signed)
 noted

## 2023-12-02 ENCOUNTER — Ambulatory Visit (HOSPITAL_BASED_OUTPATIENT_CLINIC_OR_DEPARTMENT_OTHER)
Admission: RE | Admit: 2023-12-02 | Discharge: 2023-12-02 | Disposition: A | Discharge status: Home / Self Care | Source: Ambulatory Visit | Attending: Family Medicine | Admitting: Family Medicine

## 2023-12-02 DIAGNOSIS — R0609 Other forms of dyspnea: Secondary | ICD-10-CM | POA: Insufficient documentation

## 2023-12-03 ENCOUNTER — Encounter: Payer: Self-pay | Admitting: Family Medicine

## 2023-12-03 LAB — ECHOCARDIOGRAM COMPLETE
AR max vel: 2.57 cm2
AV Area VTI: 2.36 cm2
AV Area mean vel: 2.35 cm2
AV Mean grad: 2 mmHg
AV Peak grad: 4.5 mmHg
Ao pk vel: 1.06 m/s
Area-P 1/2: 3.12 cm2
Calc EF: 73 %
S' Lateral: 2.2 cm
Single Plane A2C EF: 69 %
Single Plane A4C EF: 73.7 %

## 2024-02-05 DIAGNOSIS — R194 Change in bowel habit: Secondary | ICD-10-CM | POA: Diagnosis not present

## 2024-02-05 DIAGNOSIS — R1013 Epigastric pain: Secondary | ICD-10-CM | POA: Diagnosis not present

## 2024-03-10 DIAGNOSIS — F32A Depression, unspecified: Secondary | ICD-10-CM | POA: Diagnosis not present

## 2024-03-10 DIAGNOSIS — Z1211 Encounter for screening for malignant neoplasm of colon: Secondary | ICD-10-CM | POA: Diagnosis not present

## 2024-03-10 DIAGNOSIS — R1013 Epigastric pain: Secondary | ICD-10-CM | POA: Diagnosis not present

## 2024-03-10 DIAGNOSIS — K429 Umbilical hernia without obstruction or gangrene: Secondary | ICD-10-CM | POA: Diagnosis not present

## 2024-03-10 DIAGNOSIS — F419 Anxiety disorder, unspecified: Secondary | ICD-10-CM | POA: Diagnosis not present

## 2024-03-10 DIAGNOSIS — K2100 Gastro-esophageal reflux disease with esophagitis, without bleeding: Secondary | ICD-10-CM | POA: Diagnosis not present

## 2024-03-10 DIAGNOSIS — K209 Esophagitis, unspecified without bleeding: Secondary | ICD-10-CM | POA: Diagnosis not present

## 2024-03-10 DIAGNOSIS — R194 Change in bowel habit: Secondary | ICD-10-CM | POA: Diagnosis not present

## 2024-03-10 DIAGNOSIS — G43909 Migraine, unspecified, not intractable, without status migrainosus: Secondary | ICD-10-CM | POA: Diagnosis not present

## 2024-03-10 DIAGNOSIS — K5900 Constipation, unspecified: Secondary | ICD-10-CM | POA: Diagnosis not present

## 2024-03-10 DIAGNOSIS — K219 Gastro-esophageal reflux disease without esophagitis: Secondary | ICD-10-CM | POA: Diagnosis not present

## 2024-03-26 DIAGNOSIS — R0609 Other forms of dyspnea: Secondary | ICD-10-CM | POA: Diagnosis not present

## 2024-03-26 DIAGNOSIS — R0602 Shortness of breath: Secondary | ICD-10-CM | POA: Diagnosis not present

## 2024-03-26 DIAGNOSIS — K2100 Gastro-esophageal reflux disease with esophagitis, without bleeding: Secondary | ICD-10-CM | POA: Diagnosis not present

## 2024-04-07 DIAGNOSIS — H2513 Age-related nuclear cataract, bilateral: Secondary | ICD-10-CM | POA: Diagnosis not present

## 2024-04-07 DIAGNOSIS — H353111 Nonexudative age-related macular degeneration, right eye, early dry stage: Secondary | ICD-10-CM | POA: Diagnosis not present

## 2024-04-07 DIAGNOSIS — H35412 Lattice degeneration of retina, left eye: Secondary | ICD-10-CM | POA: Diagnosis not present

## 2024-04-08 DIAGNOSIS — E785 Hyperlipidemia, unspecified: Secondary | ICD-10-CM | POA: Diagnosis not present

## 2024-04-08 DIAGNOSIS — R072 Precordial pain: Secondary | ICD-10-CM | POA: Diagnosis not present

## 2024-04-08 DIAGNOSIS — K209 Esophagitis, unspecified without bleeding: Secondary | ICD-10-CM | POA: Diagnosis not present

## 2024-04-08 DIAGNOSIS — R0609 Other forms of dyspnea: Secondary | ICD-10-CM | POA: Diagnosis not present

## 2024-04-12 DIAGNOSIS — R0609 Other forms of dyspnea: Secondary | ICD-10-CM | POA: Diagnosis not present

## 2024-04-30 DIAGNOSIS — Z1231 Encounter for screening mammogram for malignant neoplasm of breast: Secondary | ICD-10-CM | POA: Diagnosis not present

## 2024-05-06 DIAGNOSIS — R0609 Other forms of dyspnea: Secondary | ICD-10-CM | POA: Diagnosis not present

## 2024-05-25 ENCOUNTER — Encounter: Payer: Self-pay | Admitting: Family Medicine

## 2024-05-25 ENCOUNTER — Ambulatory Visit: Payer: Self-pay | Admitting: Family Medicine

## 2024-05-25 ENCOUNTER — Ambulatory Visit: Admitting: Family Medicine

## 2024-05-25 VITALS — BP 124/72 | HR 73 | Temp 97.4°F | Resp 16 | Ht 64.0 in | Wt 144.2 lb

## 2024-05-25 DIAGNOSIS — Z23 Encounter for immunization: Secondary | ICD-10-CM | POA: Diagnosis not present

## 2024-05-25 DIAGNOSIS — M810 Age-related osteoporosis without current pathological fracture: Secondary | ICD-10-CM | POA: Diagnosis not present

## 2024-05-25 DIAGNOSIS — E7800 Pure hypercholesterolemia, unspecified: Secondary | ICD-10-CM | POA: Diagnosis not present

## 2024-05-25 DIAGNOSIS — Z0000 Encounter for general adult medical examination without abnormal findings: Secondary | ICD-10-CM

## 2024-05-25 LAB — VITAMIN D 25 HYDROXY (VIT D DEFICIENCY, FRACTURES): VITD: 27.98 ng/mL — ABNORMAL LOW (ref 30.00–100.00)

## 2024-05-25 NOTE — Progress Notes (Signed)
 Chief Complaint  Patient presents with   Annual Exam    CPE     Well Woman Kayla Savage is here for a complete physical.   Her last physical was >1 year ago.  Current diet: in general, a healthy diet. Current exercise: walking. Weight is stable and she denies daytime fatigue. Seatbelt? Yes Advanced directive? Yes  Health Maintenance Colonoscopy- Yes Shingrix- No DEXA- Yes Mammogram- Yes Tetanus- Due Pneumonia- Yes Hep C screen- Yes  Past Medical History:  Diagnosis Date   Hearing loss    Osteoporosis    Tinnitus      Past Surgical History:  Procedure Laterality Date   FACIAL COSMETIC SURGERY     NOSE SURGERY  10/02/2016   due to injury   TONSILLECTOMY      Medications  Current Outpatient Medications on File Prior to Visit  Medication Sig Dispense Refill   aspirin-acetaminophen-caffeine (EXCEDRIN MIGRAINE) 250-250-65 MG per tablet Take 1 tablet by mouth every 6 (six) hours as needed for pain.     Multiple Vitamin (MULTIVITAMIN) tablet Take 1 tablet by mouth daily.     rosuvastatin  (CRESTOR ) 10 MG tablet Take 1 tablet (10 mg total) by mouth daily. 90 tablet 3    Allergies Allergies  Allergen Reactions   Boniva [Ibandronate]    Evista [Raloxifene]    Lexapro [Escitalopram Oxalate]     Fatigue, dizzy, insomnia   Other     IV Dye   Prolia [Denosumab]     Review of Systems: Constitutional:  no fevers Eye:  no recent significant change in vision Ears:  No changes in hearing Nose/Mouth/Throat:  no complaints of nasal congestion, no sore throat Cardiovascular: no chest pain Respiratory:  No shortness of breath Gastrointestinal:  No change in bowel habits GU:  Female: negative for dysuria Integumentary:  no abnormal skin lesions reported Neurologic:  no headaches Endocrine:  denies unexplained weight changes  Exam BP 124/72 (BP Location: Left Arm, Patient Position: Sitting)   Pulse 73   Temp (!) 97.4 F (36.3 C) (Temporal)   Resp 16   Ht 5' 4  (1.626 m)   Wt 144 lb 3.2 oz (65.4 kg)   LMP 11/23/2004   SpO2 98%   BMI 24.75 kg/m  General:  well developed, well nourished, in no apparent distress Skin:  no significant moles, warts, or growths Head:  no masses, lesions, or tenderness Eyes:  pupils equal and round, sclera anicteric without injection Ears:  canals without lesions, TMs shiny without retraction, no obvious effusion, no erythema Nose:  nares patent, mucosa normal, and no drainage Throat/Pharynx:  lips and gingiva without lesion; tongue and uvula midline; non-inflamed pharynx; no exudates or postnasal drainage Neck: neck supple without adenopathy, thyromegaly, or masses Lungs:  clear to auscultation, breath sounds equal bilaterally, no respiratory distress Cardio:  regular rate and rhythm, no bruits or LE edema Abdomen:  abdomen soft, ttp in epigastric and periumbilical region; bowel sounds normal; no masses or organomegaly Genital: Deferred Neuro:  gait normal; deep tendon reflexes normal and symmetric Psych: well oriented with normal range of affect and appropriate judgment/insight  Assessment and Plan  Well adult exam  Age-related osteoporosis without current pathological fracture  - Plan: VITAMIN D  25 Hydroxy (Vit-D Deficiency, Fractures)   Well 73 y.o. female. Counseled on diet and exercise. Advanced directive form requested today.  Flu shot today.  Shingrix and Td booster to get at the pharmacy.  Other orders as above. Follow up in 6 mo. The patient  voiced understanding and agreement to the plan.  Mabel [place], DO 05/25/24 10:12 AM

## 2024-05-25 NOTE — Patient Instructions (Addendum)
 Give us  2-3 business days to get the results of your labs back.   Keep the diet clean and stay active.  Please consider adding some weight resistance exercise to your routine. Consider yoga as well.   Please get your tetanus booster at the pharmacy.   The Shingrix vaccine (for shingles) is a 2 shot series spaced 2-6 months apart. It can make people feel low energy, achy and almost like they have the flu for 48 hours after injection. 1/5 people can have nausea and/or vomiting. Please plan accordingly when deciding on when to get this shot. Call your pharmacy to get this. The second shot of the series is less severe regarding the side effects, but it still lasts 48 hours.   Let us  know if you need anything.

## 2024-05-25 NOTE — Addendum Note (Signed)
 Addended by: Miyu Fenderson M on: 05/25/2024 10:51 AM   Modules accepted: Orders

## 2024-11-18 ENCOUNTER — Ambulatory Visit: Admitting: Family Medicine

## 2024-11-22 ENCOUNTER — Ambulatory Visit: Admitting: Family Medicine

## 2024-12-13 ENCOUNTER — Ambulatory Visit (HOSPITAL_BASED_OUTPATIENT_CLINIC_OR_DEPARTMENT_OTHER): Payer: Self-pay | Admitting: Obstetrics & Gynecology
